# Patient Record
Sex: Female | Born: 1960
Health system: Southern US, Community
[De-identification: ages and names within clinical notes are randomized; demographics above are authoritative.]

## PROBLEM LIST (undated history)

## (undated) DIAGNOSIS — R635 Abnormal weight gain: Secondary | ICD-10-CM

## (undated) DIAGNOSIS — M2550 Pain in unspecified joint: Secondary | ICD-10-CM

## (undated) DIAGNOSIS — K5730 Diverticulosis of large intestine without perforation or abscess without bleeding: Secondary | ICD-10-CM

## (undated) DIAGNOSIS — E538 Deficiency of other specified B group vitamins: Secondary | ICD-10-CM

## (undated) DIAGNOSIS — R5383 Other fatigue: Secondary | ICD-10-CM

## (undated) DIAGNOSIS — E7800 Pure hypercholesterolemia, unspecified: Secondary | ICD-10-CM

## (undated) HISTORY — DX: Pure hypercholesterolemia, unspecified: E78.00

## (undated) HISTORY — DX: Pain in unspecified joint: M25.50

## (undated) HISTORY — DX: Abnormal weight gain: R63.5

## (undated) HISTORY — DX: Diverticulosis of large intestine without perforation or abscess without bleeding: K57.30

## (undated) HISTORY — DX: Deficiency of other specified B group vitamins: E53.8

## (undated) HISTORY — DX: Other fatigue: R53.83

---

## 2001-12-06 ENCOUNTER — Encounter: Payer: Self-pay | Admitting: Family Medicine

## 2001-12-06 ENCOUNTER — Encounter: Admission: RE | Admit: 2001-12-06 | Discharge: 2001-12-06 | Payer: Self-pay | Admitting: Family Medicine

## 2003-08-12 ENCOUNTER — Other Ambulatory Visit: Admission: RE | Admit: 2003-08-12 | Discharge: 2003-08-12 | Payer: Self-pay | Admitting: Family Medicine

## 2003-08-28 ENCOUNTER — Encounter: Admission: RE | Admit: 2003-08-28 | Discharge: 2003-08-28 | Payer: Self-pay | Admitting: Family Medicine

## 2004-06-30 ENCOUNTER — Ambulatory Visit: Payer: Self-pay | Admitting: Internal Medicine

## 2004-11-19 ENCOUNTER — Ambulatory Visit: Payer: Self-pay | Admitting: Internal Medicine

## 2005-01-13 ENCOUNTER — Ambulatory Visit: Payer: Self-pay | Admitting: Internal Medicine

## 2005-03-09 ENCOUNTER — Ambulatory Visit: Payer: Self-pay | Admitting: Internal Medicine

## 2005-07-07 ENCOUNTER — Ambulatory Visit: Payer: Self-pay | Admitting: Internal Medicine

## 2005-07-12 ENCOUNTER — Ambulatory Visit: Payer: Self-pay | Admitting: Internal Medicine

## 2005-10-28 ENCOUNTER — Other Ambulatory Visit: Admission: RE | Admit: 2005-10-28 | Discharge: 2005-10-28 | Payer: Self-pay | Admitting: Gynecology

## 2007-02-17 ENCOUNTER — Ambulatory Visit: Payer: Self-pay | Admitting: Internal Medicine

## 2007-02-17 DIAGNOSIS — R5383 Other fatigue: Secondary | ICD-10-CM

## 2007-02-17 DIAGNOSIS — K5730 Diverticulosis of large intestine without perforation or abscess without bleeding: Secondary | ICD-10-CM | POA: Insufficient documentation

## 2007-02-17 DIAGNOSIS — B079 Viral wart, unspecified: Secondary | ICD-10-CM | POA: Insufficient documentation

## 2007-02-17 DIAGNOSIS — R5381 Other malaise: Secondary | ICD-10-CM | POA: Insufficient documentation

## 2007-02-17 HISTORY — DX: Diverticulosis of large intestine without perforation or abscess without bleeding: K57.30

## 2007-02-18 ENCOUNTER — Telehealth: Payer: Self-pay | Admitting: Internal Medicine

## 2007-02-20 ENCOUNTER — Encounter (INDEPENDENT_AMBULATORY_CARE_PROVIDER_SITE_OTHER): Payer: Self-pay | Admitting: *Deleted

## 2007-03-15 ENCOUNTER — Ambulatory Visit: Payer: Self-pay | Admitting: Internal Medicine

## 2007-03-15 DIAGNOSIS — N3090 Cystitis, unspecified without hematuria: Secondary | ICD-10-CM | POA: Insufficient documentation

## 2007-03-16 LAB — CONVERTED CEMR LAB
Albumin: 3.7 g/dL (ref 3.5–5.2)
Basophils Absolute: 0.1 10*3/uL (ref 0.0–0.1)
Bilirubin, Direct: 0.2 mg/dL (ref 0.0–0.3)
Cholesterol: 277 mg/dL (ref 0–200)
Creatinine, Ser: 0.9 mg/dL (ref 0.4–1.2)
Direct LDL: 211.3 mg/dL
Eosinophils Absolute: 0.3 10*3/uL (ref 0.0–0.6)
Glucose, Bld: 97 mg/dL (ref 70–99)
HCT: 41.5 % (ref 36.0–46.0)
Hemoglobin, Urine: NEGATIVE
Hemoglobin: 14.2 g/dL (ref 12.0–15.0)
MCHC: 34.2 g/dL (ref 30.0–36.0)
MCV: 90.4 fL (ref 78.0–100.0)
Monocytes Absolute: 0.3 10*3/uL (ref 0.2–0.7)
Neutrophils Relative %: 45.7 % (ref 43.0–77.0)
Potassium: 4.4 meq/L (ref 3.5–5.1)
Sodium: 140 meq/L (ref 135–145)
Total Bilirubin: 0.8 mg/dL (ref 0.3–1.2)
Total Protein: 6.9 g/dL (ref 6.0–8.3)
Urobilinogen, UA: 0.2 (ref 0.0–1.0)
Vitamin B-12: 166 pg/mL — ABNORMAL LOW (ref 211–911)

## 2007-03-31 ENCOUNTER — Encounter: Payer: Self-pay | Admitting: Internal Medicine

## 2007-05-05 ENCOUNTER — Telehealth: Payer: Self-pay | Admitting: Internal Medicine

## 2007-08-18 ENCOUNTER — Ambulatory Visit: Payer: Self-pay | Admitting: Internal Medicine

## 2007-08-18 DIAGNOSIS — E538 Deficiency of other specified B group vitamins: Secondary | ICD-10-CM

## 2007-08-18 DIAGNOSIS — D485 Neoplasm of uncertain behavior of skin: Secondary | ICD-10-CM | POA: Insufficient documentation

## 2007-08-18 DIAGNOSIS — E559 Vitamin D deficiency, unspecified: Secondary | ICD-10-CM | POA: Insufficient documentation

## 2007-08-18 HISTORY — DX: Deficiency of other specified B group vitamins: E53.8

## 2007-08-18 LAB — CONVERTED CEMR LAB
Bilirubin Urine: NEGATIVE
Hemoglobin, Urine: NEGATIVE
Ketones, ur: NEGATIVE mg/dL
Mucus, UA: NEGATIVE
Total Protein, Urine: NEGATIVE mg/dL
Urine Glucose: NEGATIVE mg/dL

## 2007-09-01 ENCOUNTER — Telehealth: Payer: Self-pay | Admitting: Internal Medicine

## 2008-04-26 ENCOUNTER — Encounter: Payer: Self-pay | Admitting: Internal Medicine

## 2008-06-24 ENCOUNTER — Telehealth: Payer: Self-pay | Admitting: Internal Medicine

## 2008-08-12 ENCOUNTER — Telehealth (INDEPENDENT_AMBULATORY_CARE_PROVIDER_SITE_OTHER): Payer: Self-pay | Admitting: *Deleted

## 2008-08-13 ENCOUNTER — Ambulatory Visit: Payer: Self-pay | Admitting: Internal Medicine

## 2008-08-13 DIAGNOSIS — K921 Melena: Secondary | ICD-10-CM | POA: Insufficient documentation

## 2008-08-13 DIAGNOSIS — J069 Acute upper respiratory infection, unspecified: Secondary | ICD-10-CM | POA: Insufficient documentation

## 2008-08-13 LAB — CONVERTED CEMR LAB: Vit D, 25-Hydroxy: 37 ng/mL (ref 30–89)

## 2008-08-14 LAB — CONVERTED CEMR LAB
ALT: 22 units/L (ref 0–35)
BUN: 7 mg/dL (ref 6–23)
Basophils Relative: 0.2 % (ref 0.0–3.0)
Bilirubin Urine: NEGATIVE
Bilirubin, Direct: 0.1 mg/dL (ref 0.0–0.3)
CO2: 30 meq/L (ref 19–32)
Chloride: 108 meq/L (ref 96–112)
Creatinine, Ser: 0.9 mg/dL (ref 0.4–1.2)
Eosinophils Absolute: 0.2 10*3/uL (ref 0.0–0.7)
HCT: 42.2 % (ref 36.0–46.0)
Hemoglobin, Urine: NEGATIVE
Leukocytes, UA: NEGATIVE
Lymphs Abs: 1.1 10*3/uL (ref 0.7–4.0)
MCHC: 34.9 g/dL (ref 30.0–36.0)
MCV: 91.8 fL (ref 78.0–100.0)
Monocytes Absolute: 0.4 10*3/uL (ref 0.1–1.0)
Neutrophils Relative %: 72.7 % (ref 43.0–77.0)
Nitrite: NEGATIVE
Platelets: 199 10*3/uL (ref 150.0–400.0)
Potassium: 4.7 meq/L (ref 3.5–5.1)
TSH: 2.91 microintl units/mL (ref 0.35–5.50)
Total Bilirubin: 0.6 mg/dL (ref 0.3–1.2)
Total Protein, Urine: NEGATIVE mg/dL
Total Protein: 7.4 g/dL (ref 6.0–8.3)
Urobilinogen, UA: 0.2 (ref 0.0–1.0)

## 2009-01-31 ENCOUNTER — Ambulatory Visit: Payer: Self-pay | Admitting: Gastroenterology

## 2009-02-10 ENCOUNTER — Encounter: Payer: Self-pay | Admitting: Gastroenterology

## 2009-02-10 ENCOUNTER — Ambulatory Visit: Payer: Self-pay | Admitting: Gastroenterology

## 2009-02-11 ENCOUNTER — Telehealth: Payer: Self-pay | Admitting: Internal Medicine

## 2009-02-12 ENCOUNTER — Encounter: Payer: Self-pay | Admitting: Gastroenterology

## 2009-02-13 ENCOUNTER — Encounter (INDEPENDENT_AMBULATORY_CARE_PROVIDER_SITE_OTHER): Payer: Self-pay | Admitting: *Deleted

## 2009-06-06 ENCOUNTER — Encounter: Payer: Self-pay | Admitting: Internal Medicine

## 2009-09-30 ENCOUNTER — Ambulatory Visit: Payer: Self-pay | Admitting: Internal Medicine

## 2009-09-30 DIAGNOSIS — R109 Unspecified abdominal pain: Secondary | ICD-10-CM | POA: Insufficient documentation

## 2009-10-01 LAB — CONVERTED CEMR LAB
ALT: 25 units/L (ref 0–35)
Alkaline Phosphatase: 32 units/L — ABNORMAL LOW (ref 39–117)
BUN: 14 mg/dL (ref 6–23)
Basophils Relative: 1.5 % (ref 0.0–3.0)
Bilirubin Urine: NEGATIVE
Bilirubin, Direct: 0.1 mg/dL (ref 0.0–0.3)
Calcium: 9.1 mg/dL (ref 8.4–10.5)
Chloride: 105 meq/L (ref 96–112)
Creatinine, Ser: 0.9 mg/dL (ref 0.4–1.2)
Eosinophils Relative: 5.2 % — ABNORMAL HIGH (ref 0.0–5.0)
GFR calc non Af Amer: 74.77 mL/min (ref >60)
Lymphocytes Relative: 38 % (ref 12.0–46.0)
MCV: 92.2 fL (ref 78.0–100.0)
Monocytes Absolute: 0.4 10*3/uL (ref 0.1–1.0)
Monocytes Relative: 7.3 % (ref 3.0–12.0)
Neutrophils Relative %: 48 % (ref 43.0–77.0)
Nitrite: NEGATIVE
Platelets: 239 10*3/uL (ref 150.0–400.0)
RBC: 4.54 M/uL (ref 3.87–5.11)
Total Bilirubin: 0.4 mg/dL (ref 0.3–1.2)
Total Protein, Urine: NEGATIVE mg/dL
Total Protein: 7 g/dL (ref 6.0–8.3)
Urine Glucose: NEGATIVE mg/dL
WBC: 5.6 10*3/uL (ref 4.5–10.5)
pH: 6.5 (ref 5.0–8.0)

## 2009-10-08 ENCOUNTER — Telehealth: Payer: Self-pay | Admitting: Internal Medicine

## 2009-10-10 ENCOUNTER — Ambulatory Visit: Payer: Self-pay | Admitting: Cardiology

## 2009-10-14 ENCOUNTER — Telehealth (INDEPENDENT_AMBULATORY_CARE_PROVIDER_SITE_OTHER): Payer: Self-pay | Admitting: *Deleted

## 2009-10-16 ENCOUNTER — Ambulatory Visit: Payer: Self-pay | Admitting: Internal Medicine

## 2010-04-28 NOTE — Progress Notes (Signed)
Summary: Abd CT scan?  Phone Note Call from Patient Call back at Work Phone [phone]   Caller: Patient [phone] Summary of Call: Pt called stating that she is still having abd pain after completing course of ABX. Pt is requesting to have CT scan of abd done. Initial call taken by: Margaret Pyle, CMA,  October 08, 2009 11:14 AM  Follow-up for Phone Call        order placed requesting same [facility] will arrange - will call with results of scan after reviewed Follow-up by: Newt Lukes MD,  October 08, 2009 12:56 PM  Additional Follow-up for Phone Call Additional follow up Details #1::        pt informed Additional Follow-up by: Margaret Pyle, CMA,  October 08, 2009 2:01 PM

## 2010-04-28 NOTE — Progress Notes (Signed)
  Phone Note Other Incoming   Summary of Call: Abd pain - needs OV Initial call taken by: Tresa Garter MD,  October 14, 2009 7:14 AM  Follow-up for Phone Call        ok OV at 1:45 on Thursday Follow-up by: Tresa Garter MD,  October 14, 2009 7:15 AM  Additional Follow-up for Phone Call Additional follow up Details #1::        left message to vm to cb, appt made for 7/21@1 :45 pm. Additional Follow-up by: Verdell Face,  October 14, 2009 11:18 AM

## 2010-04-28 NOTE — Assessment & Plan Note (Signed)
Summary: ABD PAIN/NWS   Vital Signs:  Patient profile:   50 year old female Height:      67 inches (170.18 cm) Weight:      185.0 pounds (84.09 kg) O2 Sat:      97 % on Room air Temp:     98.5 degrees F (36.94 degrees C) oral Pulse rate:   54 / minute BP sitting:   112 / 78  (left arm) Cuff size:   regular  Vitals Entered By: Orlan Leavens (September 30, 2009 4:18 PM)  O2 Flow:  Room air CC: Abdominal pain x's 4 days Is Patient Diabetic? No Pain Assessment Patient in pain? yes     Location: abdomen Type: burning Comments Pt states she also have constipation   Primary Care Provider:  Sonda Primes, MD  CC:  Abdominal pain x's 4 days.  History of Present Illness:  Abdominal Pain      This is a 50 year old woman who presents with Abdominal pain.  The symptoms began 4 days ago.  On a scale of 1 to 10, the intensity is described as a 5.  The patient reports constipation, but denies nausea, vomiting, diarrhea, melena, hematochezia, and hematemesis.  The location of the pain is diffuse.  The pain is described as intermittent, sharp, and cramping in quality.  Associated symptoms include dysuria and vaginal bleeding.  The patient denies the following symptoms: fever, weight loss, chest pain, jaundice, and missed menstrual period.  The pain is worse with jarring of the abdomen.  The pain is better with defecation.    Clinical Review Panels:  CBC   WBC:  6.1 (08/13/2008)   RBC:  4.60 (08/13/2008)   Hgb:  14.7 (08/13/2008)   Hct:  42.2 (08/13/2008)   Platelets:  199.0 (08/13/2008)   MCV  91.8 (08/13/2008)   MCHC  34.9 (08/13/2008)   RDW  12.0 (08/13/2008)   PMN:  72.7 (08/13/2008)   Lymphs:  17.8 (08/13/2008)   Monos:  6.8 (08/13/2008)   Eosinophils:  2.5 (08/13/2008)   Basophil:  0.2 (08/13/2008)  Complete Metabolic Panel   Glucose:  96 (08/13/2008)   Sodium:  141 (08/13/2008)   Potassium:  4.7 (08/13/2008)   Chloride:  108 (08/13/2008)   CO2:  30 (08/13/2008)   BUN:  7  (08/13/2008)   Creatinine:  0.9 (08/13/2008)   Albumin:  3.8 (08/13/2008)   Total Protein:  7.4 (08/13/2008)   Calcium:  9.3 (08/13/2008)   Total Bili:  0.6 (08/13/2008)   Alk Phos:  32 (08/13/2008)   SGPT (ALT):  22 (08/13/2008)   SGOT (AST):  16 (08/13/2008)   Current Medications (verified): 1)  Vitamin D3 1000 Unit  Tabs (Cholecalciferol) .Marland Kitchen.. 1 Qd 2)  Vitamin B-12 Cr 1000 Mcg  Tbcr (Cyanocobalamin) .... Take One Tablet By Mouth Daily 3)  Triamcinolone Acetonide 0.1 % Oint (Triamcinolone Acetonide) .... As Needed  Allergies (verified): No Known Drug Allergies  Past History:  Past Medical History: Ovar. cysts Vit D def Vit B12 def  Review of Systems  The patient denies anorexia, weight loss, chest pain, syncope, peripheral edema, headaches, and severe indigestion/heartburn.    Physical Exam  General:  alert, well-developed, well-nourished, and cooperative to examination.   nontoxic Eyes:  vision grossly intact; pupils equal, round and reactive to light.  conjunctiva and lids normal.   no jaundice Lungs:  normal respiratory effort, no intercostal retractions or use of accessory muscles; normal breath sounds bilaterally - no crackles and no  wheezes.    Heart:  normal rate, regular rhythm, no murmur, and no rub. BLE without edema.  Abdomen:  soft, non-tender, normal bowel sounds, no distention; no masses and no appreciable hepatomegaly or splenomegaly.     Impression & Recommendations:  Problem # 1:  ABDOMINAL PAIN (ICD-789.00)  a/w constipation (change in bowels) and intermittent pain x 4 days not a/w activity or food - recent menses (had thought she was menopausal as no period >6 months prior to this) - also hx diverticulosis - ?infx chceck labs now emperic abx to cover poss diverticulitis or UTI - tylnol/ibuprogfen as needed - consider need for CT if worsening symptoms or not improved withthis abx tx - explained same to pt who agrees Orders: TLB-BMP (Basic  Metabolic Panel-BMET) (80048-METABOL) TLB-CBC Platelet - w/Differential (85025-CBCD) TLB-Hepatic/Liver Function Pnl (80076-HEPATIC) TLB-TSH (Thyroid Stimulating Hormone) (84443-TSH) TLB-Udip w/ Micro (81001-URINE) Prescription Created Electronically [id number])  Discussed symptom control with the patient.   Complete Medication List: 1)  Vitamin D3 1000 Unit Tabs (Cholecalciferol) .Marland Kitchen.. 1 qd 2)  Vitamin B-12 Cr 1000 Mcg Tbcr (Cyanocobalamin) .... Take one tablet by mouth daily 3)  Triamcinolone Acetonide 0.1 % Oint (Triamcinolone acetonide) .... As needed 4)  Metronidazole 500 Mg Tabs (Metronidazole) .Marland Kitchen.. 1 by mouth three times a day x 7days 5)  Ciprofloxacin Hcl 500 Mg Tabs (Ciprofloxacin hcl) .Marland Kitchen.. 1 by mouth two times a day x 7days  Patient Instructions: 1)  it was good to see you today. 2)  test(s) ordered today - your results will be posted on the phone tree for review in 48-72 hours from the time of test completion; call [phone] and enter your 9 digit MRN (listed above on this page, just below your name); if any changes need to be made or there are abnormal results, you will be contacted directly.  3)  antibiotcs as discussed for possible infection - cipro+flagyl  -your prescriptions have been electronically submitted to your pharmacy. Please take as directed. Contact our office if you believe you're having problems with the medication(s).  4)  Please schedule a follow-up appointment in 2-4 weeks with dr. Posey Rea for your physical and labs, call sooner if problems.  5)  Get plenty of rest, drink lots of clear liquids, and use Tylenol or Ibuprofen for fever and comfort, use stool softener for constipation if needed. Return in 7-10 days if you're not better:sooner if you're feeling worse. Prescriptions: CIPROFLOXACIN HCL 500 MG TABS (CIPROFLOXACIN HCL) 1 by mouth two times a day x 7days  #14 x 0   Entered and Authorized by:   Newt Lukes MD   Signed by:   Newt Lukes MD on  09/30/2009   Method used:   Electronically to        [facility]  [address] [phone][facility] (retail)       [address] [place], [place]  [address]       Ph: [phone] or [phone]       Fax: [phone]   RxID:   [id number] METRONIDAZOLE 500 MG TABS (METRONIDAZOLE) 1 by mouth three times a day x 7days  #21 x 0   Entered and Authorized by:   Newt Lukes MD   Signed by:   Newt Lukes MD on 09/30/2009   Method used:   [facility]ically to        [facility]  [address] [phone][facility] (retail)  [address]       [place], [place]  [address]       Ph: [phone] or [phone]       Fax: [phone]   RxID:   [id number]

## 2010-04-28 NOTE — Assessment & Plan Note (Signed)
Summary: abd pain / 1:45 per Dr Posey Rea / cd   Vital Signs:  Patient profile:   50 year old female Height:      67 inches Weight:      181 pounds BMI:     28.45 O2 Sat:      96 % on Room air Temp:     98.7 degrees F oral Pulse rate:   76 / minute Pulse rhythm:   regular Resp:     16 per minute BP sitting:   126 / 84  (left arm) Cuff size:   regular  Vitals Entered By: Lanier Prude, CMA(AAMA) (October 16, 2009 1:51 PM)  O2 Flow:  Room air CC: pelvic/abd pain X 2 wks   Primary Care Provider:  Sonda Primes, MD  CC:  pelvic/abd pain X 2 wks.  History of Present Illness: The patient presents for a follow up of abd pain and spasms - overalll better.Marland KitchenMarland KitchenMarland KitchenLeaving for [place] soon. She had finished the antibiotic.  Current Medications (verified): 1)  Vitamin D3 1000 Unit  Tabs (Cholecalciferol) .Marland Kitchen.. 1 Qd 2)  Vitamin B-12 Cr 1000 Mcg  Tbcr (Cyanocobalamin) .... Take One Tablet By Mouth Daily 3)  Triamcinolone Acetonide 0.1 % Oint (Triamcinolone Acetonide) .... As Needed  Allergies (verified): No Known Drug Allergies  Past History:  Past Medical History: Last updated: 09/30/2009 Ovar. 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  The patient denies fever, weight loss, weight gain, chest pain, dyspnea on exertion, melena, hematochezia, and severe indigestion/heartburn.    Physical Exam  General:  alert, well-developed, well-nourished, and cooperative to examination.   nontoxic Nose:  External nasal examination shows no deformity or inflammation. Nasal mucosa are pink and moist without lesions or  exudates. Mouth:  Oral mucosa and oropharynx without lesions or exudates.  Teeth in good repair. Neck:  No deformities, masses, or tenderness noted. Lungs:  Normal respiratory effort, chest expands symmetrically. Lungs are clear to auscultation, no crackles or wheezes. Heart:  Normal rate and regular rhythm. S1 and S2 normal without gallop, murmur, click, rub or other extra sounds. Abdomen:  Bowel sounds positive,abdomen soft and non-tender without masses, organomegaly or hernias noted. Msk:  No deformity or scoliosis noted of thoracic or lumbar spine.   Pulses:  R and L carotid,radial,femoral,dorsalis pedis and posterior tibial pulses are full and equal bilaterally Extremities:  No clubbing, cyanosis, edema, or deformity noted with normal full range of motion of all joints.   Neurologic:  No cranial nerve deficits noted. Station and gait are normal. Plantar reflexes are down-going bilaterally. DTRs are symmetrical throughout. Sensory, motor and coordinative functions appear intact. Skin:  Intact without suspicious lesions or rashes Psych:  Cognition and judgment appear intact. Alert and cooperative with normal attention span and concentration. No apparent delusions, illusions, hallucinations   Impression & Recommendations:  Problem # 1:  ABDOMINAL PAIN (ICD-789.00) Assessment Improved The exact etiology is unclear. At this point would treat constipation. The labs and CT were reviewed with the patient.   Problem # 2:  B12 DEFICIENCY (ICD-266.2) Assessment: Improved On rx  Problem # 3:  DIVERTICULOSIS,  COLON (ICD-562.10) Assessment: Unchanged  Problem # 4:  FATIGUE (ICD-780.79) Assessment: Improved  Complete Medication List: 1)  Vitamin D3 1000 Unit Tabs (Cholecalciferol) .Marland Kitchen.. 1 qd 2)  Vitamin B-12 Cr 1000 Mcg Tbcr (Cyanocobalamin) .... Take one tablet by mouth daily 3)  Triamcinolone Acetonide 0.1 % Oint (Triamcinolone acetonide) .... As needed 4)  Metamucil 0.52 Gm Caps (Psyllium)  .Marland Kitchen.. 1 by mouth two times a day for constipation 5)  Tramadol Hcl 50 Mg Tabs (Tramadol hcl) .Marland Kitchen.. 1-2 tabs by mouth two times a day as needed pain  Patient Instructions: 1)  Call if you are not better in a reasonable amount of time or if worse.  2)  Please schedule a follow-up appointment in 3 months. 3)  BMP prior to visit, ICD-9: 4)  TSH prior to visit, ICD-9: 5)  CBC w/ Diff prior to visit, ICD-9: 6)  Urine-dip prior to visit, ICD-9:789.00 26.20 7)  Vit D Prescriptions: ACYCLOVIR 800 MG TABS (ACYCLOVIR) 1 by mouth 5 times a day  #35 x 1   Entered and Authorized by:   Tresa Garter MD   Signed by:   Tresa Garter MD on 10/16/2009   Method used:   Print then Give to Patient   RxID:   [id number] TRAMADOL HCL 50 MG TABS (TRAMADOL HCL) 1-2 tabs by mouth two times a day as needed pain  #60 x 1   Entered and Authorized by:   Tresa Garter MD   Signed by:   Tresa Garter MD on 10/16/2009   Method used:   Print then Give to Patient   RxID:   [id number] METAMUCIL 0.52 GM CAPS (PSYLLIUM) 1 by mouth two times a day for constipation  #100 x 3   Entered and Authorized by:   Tresa Garter MD   Signed by:   Tresa Garter MD on 10/16/2009   Method used:   Print then Give to Patient   RxID:   [id number]

## 2010-05-26 ENCOUNTER — Encounter: Payer: Self-pay | Admitting: Internal Medicine

## 2010-05-26 ENCOUNTER — Other Ambulatory Visit: Payer: BC Managed Care – PPO

## 2010-05-26 ENCOUNTER — Other Ambulatory Visit: Payer: Self-pay | Admitting: Internal Medicine

## 2010-05-26 ENCOUNTER — Ambulatory Visit (INDEPENDENT_AMBULATORY_CARE_PROVIDER_SITE_OTHER): Payer: Self-pay | Admitting: Internal Medicine

## 2010-05-26 DIAGNOSIS — M545 Low back pain, unspecified: Secondary | ICD-10-CM | POA: Insufficient documentation

## 2010-05-26 LAB — URINALYSIS
Bilirubin Urine: NEGATIVE
Hgb urine dipstick: NEGATIVE
Total Protein, Urine: NEGATIVE
Urine Glucose: NEGATIVE

## 2010-06-09 NOTE — Assessment & Plan Note (Signed)
Summary: BACKACHE----STC   Vital Signs:  Patient profile:   50 year old female Height:      67 inches (170.18 cm) Weight:      192.13 pounds (87.33 kg) BMI:     30.20 O2 Sat:      99 % on Room air Temp:     97.3 degrees F (36.28 degrees C) oral Pulse rate:   54 / minute Resp:     14 per minute BP sitting:   108 / 64  (left arm) Cuff size:   regular  Vitals Entered By: Burnard Leigh CMA(AAMA) (May 26, 2010 10:10 AM)  O2 Flow:  Room air CC: Pt c/o of lower back pain that began on left side and radiated to right side x1 month/sls,cma Is Patient Diabetic? No   Primary Care Provider:  Sonda Primes, MD  CC:  Pt c/o of lower back pain that began on left side and radiated to right side x1 month/sls and cma.  History of Present Illness: c/o R LBP x 1 month off and on worse with standing, better w/exercise.  Preventive Screening-Counseling & Management  Caffeine-Diet-Exercise     Does Patient Exercise: yes  Current Medications (verified): 1)  Vitamin D3 1000 Unit  Tabs (Cholecalciferol) .Marland Kitchen.. 1 Qd 2)  Vitamin B-12 Cr 1000 Mcg  Tbcr (Cyanocobalamin) .... Take One Tablet By Mouth Daily 3)  Triamcinolone Acetonide 0.1 % Oint (Triamcinolone Acetonide) .... As Needed 4)  Metamucil 0.52 Gm Caps (Psyllium) .Marland Kitchen.. 1 By Mouth Two Times A Day For Constipation 5)  Tramadol Hcl 50 Mg Tabs (Tramadol Hcl) .Marland Kitchen.. 1-2 Tabs By Mouth Two Times A Day As Needed Pain  Allergies (verified): No Known Drug Allergies  Past History:  Past Medical History: Last updated: 09/30/2009 Ovar. 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    Physical Exam  General:  alert, well-developed, well-nourished, and  cooperative to examination. Mouth:  Oral mucosa and oropharynx without lesions or exudates.  Teeth in good repair. Lungs:  Normal respiratory effort, chest expands symmetrically. Lungs are clear to auscultation, no crackles or wheezes. Heart:  Normal rate and regular rhythm. S1 and S2 normal without gallop, murmur, click, rub or other extra sounds. Abdomen:  Bowel sounds positive,abdomen soft and non-tender without masses, organomegaly or hernias noted. Msk:  No deformity or scoliosis noted of thoracic or lumbar spine.   Extremities:  nl Skin:  Intact without suspicious lesions or rashes Psych:  Cognition and judgment appear intact. Alert and cooperative with normal attention span and concentration. No apparent delusions, illusions, hallucinations   Impression & Recommendations:  Problem # 1:  LOW BACK PAIN, ACUTE (ICD-724.2) R likely MSK strain Assessment New See "Patient Instructions".  Her updated medication list for this problem includes:    Tramadol Hcl 50 Mg Tabs (Tramadol hcl) .Marland Kitchen... 1-2 tabs by mouth two times a day as needed pain    Ibuprofen 600 Mg Tabs (Ibuprofen) .Marland Kitchen... 1 by mouth bid  pc x 1 wk then as needed for  pain  Orders: TLB-Udip ONLY (81003-UDIP)  Complete Medication List: 1)  Vitamin D3 1000 Unit Tabs (Cholecalciferol) .Marland Kitchen.. 1 qd 2)  Vitamin B-12 Cr 1000 Mcg Tbcr (Cyanocobalamin) .... Take one  tablet by mouth daily 3)  Triamcinolone Acetonide 0.1 % Oint (Triamcinolone acetonide) .... As needed 4)  Metamucil 0.52 Gm Caps (Psyllium) .Marland Kitchen.. 1 by mouth two times a day for constipation 5)  Tramadol Hcl 50 Mg Tabs (Tramadol hcl) .Marland Kitchen.. 1-2 tabs by mouth two times a day as needed pain 6)  Ibuprofen 600 Mg Tabs (Ibuprofen) .Marland Kitchen.. 1 by mouth bid  pc x 1 wk then as needed for  pain  Patient Instructions: 1)  Go on Youtube (www.youtube.com) and look up ", "IT band stretch" and "romboid stretch". Learn the anatomy and learn the symptoms. Do the stretches - it may help!  2)  Use  stretching and balance exercises that I have provided (15 min. or longer every day)  Prescriptions: TRAMADOL HCL 50 MG TABS (TRAMADOL HCL) 1-2 tabs by mouth two times a day as needed pain  #60 x 1   Entered and Authorized by:   Tresa Garter MD   Signed by:   Tresa Garter MD on 05/26/2010   Method used:   [facility]        [facility]  [address] [phone][facility] ([facility]ail)       [address] [place], [place]  [address]       Ph: [phone] or [phone]       Fax: [phone]   RxID:   [id number] IBUPROFEN 600 MG TABS (IBUPROFEN) 1 by mouth bid  pc x 1 wk then as needed for  pain  #60 x 3   Entered and Authorized by:   Tresa Garter MD   Signed by:   Tresa Garter MD on 05/26/2010   Method used:   [facility]  [address] [phone][facility] (retail)       [address] [place], [place]  [address]       Ph: [phone] or [phone]       Fax: [phone]   RxID:   [id number]    Orders Added: 1)  TLB-Udip ONLY [81003-UDIP] 2)  Est. Patient Level III [427[address]]

## 2010-06-24 ENCOUNTER — Encounter: Payer: Self-pay | Admitting: Internal Medicine

## 2010-12-14 ENCOUNTER — Telehealth: Payer: Self-pay | Admitting: *Deleted

## 2010-12-14 ENCOUNTER — Ambulatory Visit (INDEPENDENT_AMBULATORY_CARE_PROVIDER_SITE_OTHER): Payer: BC Managed Care – PPO | Admitting: Internal Medicine

## 2010-12-14 ENCOUNTER — Encounter: Payer: Self-pay | Admitting: Internal Medicine

## 2010-12-14 VITALS — BP 118/70 | HR 59 | Temp 98.1°F | Wt 189.0 lb

## 2010-12-14 DIAGNOSIS — H109 Unspecified conjunctivitis: Secondary | ICD-10-CM | POA: Insufficient documentation

## 2010-12-14 DIAGNOSIS — Z0000 Encounter for general adult medical examination without abnormal findings: Secondary | ICD-10-CM

## 2010-12-14 DIAGNOSIS — Z125 Encounter for screening for malignant neoplasm of prostate: Secondary | ICD-10-CM

## 2010-12-14 MED ORDER — TOBRAMYCIN 0.3 % OP SOLN: OPHTHALMIC | Status: DC

## 2010-12-14 NOTE — Patient Instructions (Signed)
Take all new medications as prescribed Continue all other medications as before  

## 2010-12-14 NOTE — Assessment & Plan Note (Signed)
Mild to mod, for antibx course,  to f/u any worsening symptoms or concerns 

## 2010-12-14 NOTE — Telephone Encounter (Signed)
Labs entered.

## 2010-12-14 NOTE — Progress Notes (Signed)
  Subjective:    Patient ID: Teresa Hubbard, female    DOB: Dec 31, 1960, 50 y.o.   MRN: [id number]  HPI Here with acute onset 2-3 days onset left eye matting, discomfort, redness, swelling and low grade temp, without trauma or eye exposure to allergens,chemicals,, etc.  Family has been ill in the past wk at home.  Pt denies chest pain, increased sob or doe, wheezing, orthopnea, PND, increased LE swelling, palpitations, dizziness or syncope.  Pt denies new neurological symptoms such as new headache, or facial or extremity weakness or numbness   Pt denies polydipsia, polyuria.   No other acute complaints.  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 - none per ptNo past surgical history on file.- none per pt  reports that she has never smoked. She does not have any smokeless tobacco history on file. She reports that she drinks alcohol. She reports that she does not use illicit drugs. family history is not on file. No Known Allergies No current outpatient prescriptions on file prior to visit.   Review of Systems All otherwise neg per pt     Objective:   Physical Exam BP 118/70  Pulse 59  Temp(Src) 98.1 F (36.7 C) (Oral)  Wt 189 lb (85.73 kg)  SpO2 96%  LMP 12/07/2010 Physical Exam  VS noted, not ill appearing Constitutional: Pt appears well-developed and well-nourished.  HENT: Head: Normocephalic.  Right Ear: External ear normal.  Left Ear: External ear normal.  Eyes: Conjunctivae and EOM are normal except for marked injection left conjunctiva, small hazy d/c. Pupils are equal, round, and reactive to light.  Neck: Normal range of motion. Neck supple.  Cardiovascular: Normal rate and regular rhythm.   Pulmonary/Chest: Effort normal and breath sounds normal.  Neurological: Pt is alert. No cranial nerve deficit.  Skin: Skin is warm. No erythema.  Psychiatric: Pt behavior is normal. Thought content normal.         Assessment &  Plan:

## 2010-12-20 ENCOUNTER — Other Ambulatory Visit: Payer: Self-pay | Admitting: Internal Medicine

## 2011-01-08 ENCOUNTER — Encounter: Payer: Self-pay | Admitting: Internal Medicine

## 2011-01-22 ENCOUNTER — Other Ambulatory Visit (INDEPENDENT_AMBULATORY_CARE_PROVIDER_SITE_OTHER): Payer: BC Managed Care – PPO

## 2011-01-22 ENCOUNTER — Other Ambulatory Visit: Payer: Self-pay | Admitting: Internal Medicine

## 2011-01-22 ENCOUNTER — Telehealth: Payer: Self-pay | Admitting: Internal Medicine

## 2011-01-22 DIAGNOSIS — Z0000 Encounter for general adult medical examination without abnormal findings: Secondary | ICD-10-CM

## 2011-01-22 DIAGNOSIS — Z125 Encounter for screening for malignant neoplasm of prostate: Secondary | ICD-10-CM

## 2011-01-22 LAB — CBC WITH DIFFERENTIAL/PLATELET
Basophils Absolute: 0.1 10*3/uL (ref 0.0–0.1)
Eosinophils Absolute: 0.2 10*3/uL (ref 0.0–0.7)
Eosinophils Relative: 4.6 % (ref 0.0–5.0)
HCT: 42.5 % (ref 36.0–46.0)
Lymphs Abs: 1.5 10*3/uL (ref 0.7–4.0)
MCHC: 33.8 g/dL (ref 30.0–36.0)
MCV: 92.5 fl (ref 78.0–100.0)
Monocytes Absolute: 0.3 10*3/uL (ref 0.1–1.0)
Neutrophils Relative %: 53.9 % (ref 43.0–77.0)
Platelets: 212 10*3/uL (ref 150.0–400.0)
RDW: 12.8 % (ref 11.5–14.6)
WBC: 4.7 10*3/uL (ref 4.5–10.5)

## 2011-01-22 LAB — LIPID PANEL
Cholesterol: 263 mg/dL — ABNORMAL HIGH (ref 0–200)
Total CHOL/HDL Ratio: 5
Triglycerides: 79 mg/dL (ref 0.0–149.0)

## 2011-01-22 LAB — BASIC METABOLIC PANEL
CO2: 26 mEq/L (ref 19–32)
Calcium: 9.1 mg/dL (ref 8.4–10.5)
Chloride: 109 mEq/L (ref 96–112)
Creatinine, Ser: 0.9 mg/dL (ref 0.4–1.2)
Glucose, Bld: 84 mg/dL (ref 70–99)

## 2011-01-22 LAB — URINALYSIS, ROUTINE W REFLEX MICROSCOPIC
Ketones, ur: NEGATIVE
Specific Gravity, Urine: >=1.03 (ref 1.000–1.030)
Urobilinogen, UA: 0.2 (ref 0.0–1.0)
pH: 5.5 (ref 5.0–8.0)

## 2011-01-22 LAB — HEPATIC FUNCTION PANEL
Bilirubin, Direct: 0.1 mg/dL (ref 0.0–0.3)
Total Bilirubin: 0.6 mg/dL (ref 0.3–1.2)

## 2011-01-22 LAB — PSA: PSA: 0 ng/mL — ABNORMAL LOW (ref 0.10–4.00)

## 2011-01-22 LAB — LDL CHOLESTEROL, DIRECT: Direct LDL: 204.9 mg/dL

## 2011-01-22 MED ORDER — CIPROFLOXACIN HCL 250 MG PO TABS: 250.0000 mg | ORAL_TABLET | Freq: Two times a day (BID) | ORAL | Status: AC

## 2011-01-22 NOTE — Telephone Encounter (Signed)
Teresa Hubbard, please, inform patient that all labs are normal except for a UTI Call in Cipro Thx

## 2011-01-25 NOTE — Telephone Encounter (Signed)
Pt informed

## 2011-01-26 ENCOUNTER — Encounter: Payer: Self-pay | Admitting: Internal Medicine

## 2011-01-26 ENCOUNTER — Ambulatory Visit (INDEPENDENT_AMBULATORY_CARE_PROVIDER_SITE_OTHER): Payer: BC Managed Care – PPO | Admitting: Internal Medicine

## 2011-01-26 VITALS — BP 126/90 | HR 68 | Temp 97.9°F | Wt 193.0 lb

## 2011-01-26 DIAGNOSIS — N3090 Cystitis, unspecified without hematuria: Secondary | ICD-10-CM | POA: Insufficient documentation

## 2011-01-26 DIAGNOSIS — Z23 Encounter for immunization: Secondary | ICD-10-CM

## 2011-01-26 DIAGNOSIS — E538 Deficiency of other specified B group vitamins: Secondary | ICD-10-CM

## 2011-01-26 DIAGNOSIS — E785 Hyperlipidemia, unspecified: Secondary | ICD-10-CM

## 2011-01-26 DIAGNOSIS — Z0000 Encounter for general adult medical examination without abnormal findings: Secondary | ICD-10-CM

## 2011-01-26 NOTE — Assessment & Plan Note (Signed)
2012 Declined statins

## 2011-01-26 NOTE — Assessment & Plan Note (Signed)
On Cipro UA in 1 mo

## 2011-01-26 NOTE — Progress Notes (Signed)
  Subjective:    Patient ID: Teresa Hubbard, female    DOB: 11-28-1960, 50 y.o.   MRN: [id number]  HPI  The patient is here for a wellness exam. The patient has been doing well overall without major physical or psychological issues going on lately. Wt Readings from Last 3 Encounters:  01/26/11 193 lb (87.544 kg)  12/14/10 189 lb (85.73 kg)  05/26/10 192 lb 2.1 oz (87.15 kg)    Review of Systems  Constitutional: Negative for fever, chills, diaphoresis, activity change, appetite change, fatigue and unexpected weight change.  HENT: Negative for hearing loss, ear pain, congestion, sore throat, sneezing, mouth sores, neck pain, dental problem, voice change, postnasal drip and sinus pressure.   Eyes: Negative for pain and visual disturbance.  Respiratory: Negative for cough, chest tightness, wheezing and stridor.   Cardiovascular: Negative for chest pain, palpitations and leg swelling.  Gastrointestinal: Negative for nausea, vomiting, abdominal pain, blood in stool, abdominal distention and rectal pain.  Genitourinary: Negative for dysuria, frequency, hematuria, decreased urine volume, vaginal bleeding, vaginal discharge, difficulty urinating, vaginal pain and menstrual problem.  Musculoskeletal: Negative for back pain, joint swelling and gait problem.  Skin: Negative for color change, pallor, rash and wound.  Neurological: Negative for dizziness, tremors, syncope, speech difficulty, weakness, light-headedness, numbness and headaches.  Hematological: Negative for adenopathy.  Psychiatric/Behavioral: Negative for suicidal ideas, hallucinations, behavioral problems, confusion, sleep disturbance, dysphoric mood and decreased concentration. The patient is not nervous/anxious and is not hyperactive.        Objective:   Physical Exam  Constitutional: She appears well-developed and well-nourished. No distress.  HENT:  Head: Normocephalic.  Right Ear: External ear normal.  Left Ear: External ear  normal.  Nose: Nose normal.  Mouth/Throat: Oropharynx is clear and moist.  Eyes: Conjunctivae are normal. Pupils are equal, round, and reactive to light. Right eye exhibits no discharge. Left eye exhibits no discharge.  Neck: Normal range of motion. Neck supple. No JVD present. No tracheal deviation present. No thyromegaly present.  Cardiovascular: Normal rate, regular rhythm and normal heart sounds.   Pulmonary/Chest: No stridor. No respiratory distress. She has no wheezes.  Abdominal: Soft. Bowel sounds are normal. She exhibits no distension and no mass. There is no tenderness. There is no rebound and no guarding.  Musculoskeletal: She exhibits no edema and no tenderness.  Lymphadenopathy:    She has no cervical adenopathy.  Neurological: She displays normal reflexes. No cranial nerve deficit. She exhibits normal muscle tone. Coordination normal.  Skin: No rash noted. No erythema.  Psychiatric: She has a normal mood and affect. Her behavior is normal. Judgment and thought content normal.   Lab Results  Component Value Date   WBC 4.7 01/22/2011   HGB 14.4 01/22/2011   HCT 42.5 01/22/2011   PLT 212.0 01/22/2011   GLUCOSE 84 01/22/2011   CHOL 263* 01/22/2011   TRIG 79.0 01/22/2011   HDL 55.70 01/22/2011   LDLDIRECT 204.9 01/22/2011   ALT 40* 01/22/2011   AST 23 01/22/2011   NA 141 01/22/2011   K 5.0 01/22/2011   CL 109 01/22/2011   CREATININE 0.9 01/22/2011   BUN 11 01/22/2011   CO2 26 01/22/2011   TSH 3.20 01/22/2011   PSA 0.00* 01/22/2011          Assessment & Plan:

## 2011-01-26 NOTE — Assessment & Plan Note (Signed)
We discussed age appropriate health related issues, including available/recomended screening tests and vaccinations. We discussed a need for adhering to healthy diet and exercise. Labs/EKG were reviewed/ordered. All questions were answered.   

## 2011-01-26 NOTE — Assessment & Plan Note (Signed)
Continue with current prescription therapy as reflected on the Med list.  

## 2011-04-29 ENCOUNTER — Telehealth: Payer: Self-pay | Admitting: Internal Medicine

## 2011-04-29 MED ORDER — CIPROFLOXACIN HCL 250 MG PO TABS: 250.0000 mg | ORAL_TABLET | Freq: Two times a day (BID) | ORAL | Status: AC

## 2011-04-29 NOTE — Telephone Encounter (Signed)
[facility]I - needs abx OK Cipro - done

## 2011-10-19 ENCOUNTER — Encounter: Payer: Self-pay | Admitting: Internal Medicine

## 2011-10-29 ENCOUNTER — Encounter: Payer: Self-pay | Admitting: Internal Medicine

## 2011-10-29 ENCOUNTER — Ambulatory Visit (INDEPENDENT_AMBULATORY_CARE_PROVIDER_SITE_OTHER): Payer: BC Managed Care – PPO | Admitting: Internal Medicine

## 2011-10-29 ENCOUNTER — Other Ambulatory Visit (INDEPENDENT_AMBULATORY_CARE_PROVIDER_SITE_OTHER): Payer: BC Managed Care – PPO

## 2011-10-29 VITALS — BP 130/78 | HR 86 | Temp 98.8°F | Resp 16 | Wt 195.0 lb

## 2011-10-29 DIAGNOSIS — R109 Unspecified abdominal pain: Secondary | ICD-10-CM

## 2011-10-29 DIAGNOSIS — E559 Vitamin D deficiency, unspecified: Secondary | ICD-10-CM

## 2011-10-29 DIAGNOSIS — K5730 Diverticulosis of large intestine without perforation or abscess without bleeding: Secondary | ICD-10-CM | POA: Insufficient documentation

## 2011-10-29 DIAGNOSIS — E538 Deficiency of other specified B group vitamins: Secondary | ICD-10-CM

## 2011-10-29 LAB — URINALYSIS, ROUTINE W REFLEX MICROSCOPIC
Total Protein, Urine: 30
Urine Glucose: NEGATIVE
pH: 6 (ref 5.0–8.0)

## 2011-10-29 LAB — HEPATIC FUNCTION PANEL
ALT: 21 U/L (ref 0–35)
AST: 16 U/L (ref 0–37)
Albumin: 3.7 g/dL (ref 3.5–5.2)
Alkaline Phosphatase: 33 U/L — ABNORMAL LOW (ref 39–117)
Total Protein: 7.1 g/dL (ref 6.0–8.3)

## 2011-10-29 LAB — CBC WITH DIFFERENTIAL/PLATELET
Basophils Absolute: 0 10*3/uL (ref 0.0–0.1)
Hemoglobin: 13.6 g/dL (ref 12.0–15.0)
Lymphocytes Relative: 10.3 % — ABNORMAL LOW (ref 12.0–46.0)
Monocytes Relative: 6.3 % (ref 3.0–12.0)
Neutro Abs: 9.7 10*3/uL — ABNORMAL HIGH (ref 1.4–7.7)
RDW: 12.9 % (ref 11.5–14.6)
WBC: 11.8 10*3/uL — ABNORMAL HIGH (ref 4.5–10.5)

## 2011-10-29 MED ORDER — HYDROCODONE-ACETAMINOPHEN 7.5-325 MG PO TABS: 1.0000 | ORAL_TABLET | Freq: Four times a day (QID) | ORAL | Status: AC | PRN

## 2011-10-29 MED ORDER — CIPROFLOXACIN HCL 500 MG PO TABS: 500.0000 mg | ORAL_TABLET | Freq: Two times a day (BID) | ORAL | Status: DC

## 2011-10-29 MED ORDER — PROMETHAZINE HCL 25 MG PO TABS: 25.0000 mg | ORAL_TABLET | Freq: Three times a day (TID) | ORAL | Status: DC | PRN

## 2011-10-29 NOTE — Assessment & Plan Note (Addendum)
8/13 diffuse R>L -- poss ovar cyst rupture vs other LMP 10/28/11 (5 d early) Labs CT if needed Clear liquids Emp[facility] Cipro

## 2011-10-29 NOTE — Patient Instructions (Addendum)
Clear liquids Go to ER if sick!

## 2011-10-30 ENCOUNTER — Encounter (HOSPITAL_COMMUNITY): Payer: Self-pay | Admitting: Emergency Medicine

## 2011-10-30 ENCOUNTER — Emergency Department (HOSPITAL_COMMUNITY)
Admission: EM | Admit: 2011-10-30 | Discharge: 2011-10-30 | Disposition: A | Discharge status: Home / Self Care | Payer: BC Managed Care – PPO | Attending: Emergency Medicine | Admitting: Emergency Medicine

## 2011-10-30 ENCOUNTER — Emergency Department (HOSPITAL_COMMUNITY): Payer: BC Managed Care – PPO

## 2011-10-30 DIAGNOSIS — K5730 Diverticulosis of large intestine without perforation or abscess without bleeding: Secondary | ICD-10-CM

## 2011-10-30 LAB — CBC WITH DIFFERENTIAL/PLATELET
Basophils Absolute: 0 10*3/uL (ref 0.0–0.1)
Basophils Relative: 0 % (ref 0–1)
Eosinophils Absolute: 0.1 10*3/uL (ref 0.0–0.7)
MCHC: 35 g/dL (ref 30.0–36.0)
Neutro Abs: 9.4 10*3/uL — ABNORMAL HIGH (ref 1.7–7.7)
Neutrophils Relative %: 85 % — ABNORMAL HIGH (ref 43–77)
RDW: 12.8 % (ref 11.5–15.5)

## 2011-10-30 LAB — URINE MICROSCOPIC-ADD ON

## 2011-10-30 LAB — COMPREHENSIVE METABOLIC PANEL
ALT: 18 U/L (ref 0–35)
Albumin: 3.4 g/dL — ABNORMAL LOW (ref 3.5–5.2)
Alkaline Phosphatase: 41 U/L (ref 39–117)
BUN: 9 mg/dL (ref 6–23)
Calcium: 9 mg/dL (ref 8.4–10.5)
Potassium: 3.7 mEq/L (ref 3.5–5.1)
Sodium: 134 mEq/L — ABNORMAL LOW (ref 135–145)
Total Protein: 7.4 g/dL (ref 6.0–8.3)

## 2011-10-30 LAB — URINALYSIS, ROUTINE W REFLEX MICROSCOPIC
Nitrite: NEGATIVE
Specific Gravity, Urine: 1.033 — ABNORMAL HIGH (ref 1.005–1.030)
pH: 5.5 (ref 5.0–8.0)

## 2011-10-30 LAB — POCT I-STAT TROPONIN I: Troponin i, poc: 0 ng/mL (ref 0.00–0.08)

## 2011-10-30 MED ORDER — IOHEXOL 300 MG/ML  SOLN
100.0000 mL | Freq: Once | INTRAMUSCULAR | Status: AC | PRN
  Administered 2011-10-30: 100 mL via INTRAVENOUS

## 2011-10-30 MED ORDER — HYDROMORPHONE HCL PF 1 MG/ML IJ SOLN
1.0000 mg | Freq: Once | INTRAMUSCULAR | Status: AC
  Administered 2011-10-30: 1 mg via INTRAVENOUS
  Filled 2011-10-30: qty 1

## 2011-10-30 MED ORDER — METRONIDAZOLE 500 MG PO TABS: 500.0000 mg | ORAL_TABLET | Freq: Two times a day (BID) | ORAL | Status: DC

## 2011-10-30 MED ORDER — ONDANSETRON HCL 4 MG/2ML IJ SOLN
4.0000 mg | Freq: Once | INTRAMUSCULAR | Status: AC
  Administered 2011-10-30: 4 mg via INTRAVENOUS
  Filled 2011-10-30: qty 2

## 2011-10-30 MED ORDER — SODIUM CHLORIDE 0.9 % IV SOLN
Freq: Once | INTRAVENOUS | Status: AC
  Administered 2011-10-30: 50 mL/h via INTRAVENOUS

## 2011-10-30 MED ORDER — METRONIDAZOLE IN NACL 5-0.79 MG/ML-% IV SOLN
500.0000 mg | Freq: Once | INTRAVENOUS | Status: AC
  Administered 2011-10-30: 500 mg via INTRAVENOUS
  Filled 2011-10-30: qty 100

## 2011-10-30 NOTE — ED Notes (Signed)
Patient reports that she went to MD today and was sent here due to lower right sided pain. The patient denies N/V/D

## 2011-10-30 NOTE — ED Provider Notes (Signed)
History     CSN: [id number]  Arrival date & time 10/29/11  2349   First MD Initiated Contact with Patient 10/30/11 [phone]      Chief Complaint  Patient presents with  . Abdominal Pain    sharp pain to right lower abdominal started 2 days ago    (Consider location/radiation/quality/duration/timing/severity/associated sxs/prior treatment) HPI Comments: States that on Wednesday.  She had diffuse, vague abdominal pain.  That has progressed over the past 48 hours to right lower quadrant pain.  She was seen by her primary care physician earlier today.  Was given a prescription for Vicodin, and Cipro, although she is unsure why.  She was given Cipro, urine, and blood tests were done at the office, but have not been resulted to.  She was told that if her pain got worse.  She was to come to the emergency department for further evaluation  Patient is a 51 y.o. female presenting with abdominal pain. The history is provided by the patient.  Abdominal Pain The primary symptoms of the illness include abdominal pain, nausea and vaginal bleeding. The primary symptoms of the illness do not include fever, shortness of breath, vomiting, diarrhea or dysuria. The current episode started more than 2 days ago. The onset of the illness was gradual.  The abdominal pain began more than 2 days ago. The pain came on gradually. The abdominal pain has been gradually worsening since its onset. The abdominal pain is located in the RLQ. The severity of the abdominal pain is 8/10. The abdominal pain is relieved by nothing. The abdominal pain is exacerbated by certain positions.  Symptoms associated with the illness do not include chills.    Past Medical History  Diagnosis Date  . B12 DEFICIENCY 08/18/2007  . DIVERTICULOSIS, COLON 02/17/2007  . Diverticulosis     History reviewed. No pertinent past surgical history.  History reviewed. No pertinent family history.  History  Substance Use Topics  . Smoking status:  Never Smoker   . Smokeless tobacco: Not on file  . Alcohol Use: Yes     wine occasional    OB History    Grav Para Term Preterm Abortions TAB SAB Ect Mult Living                  Review of Systems  Constitutional: Negative for fever and chills.  HENT: Negative for rhinorrhea.   Respiratory: Negative for shortness of breath.   Cardiovascular: Negative for chest pain.  Gastrointestinal: Positive for nausea and abdominal pain. Negative for vomiting and diarrhea.  Genitourinary: Positive for vaginal bleeding. Negative for dysuria.  Neurological: Negative for dizziness, weakness and headaches.    Allergies  Review of patient's allergies indicates no known allergies.  Home Medications   Current Outpatient Rx  Name Route Sig Dispense Refill  . CIPROFLOXACIN HCL 500 MG PO TABS Oral Take 1 tablet (500 mg total) by mouth 2 (two) times daily. 20 tablet 1  . CYANOCOBALAMIN PO Oral Take 1 each by mouth daily.     Marland Kitchen HYDROCODONE-ACETAMINOPHEN 7.5-325 MG PO TABS Oral Take 1 tablet by mouth 4 (four) times daily as needed for pain. 120 tablet 2  . VITAMIN C 500 MG PO TABS Oral Take 500 mg by mouth daily.      Marland Kitchen PROMETHAZINE HCL 25 MG PO TABS Oral Take 1 tablet (25 mg total) by mouth every 8 (eight) hours as needed for nausea. 20 tablet 0    BP 116/55  Pulse 76  Temp  99 F (37.2 C) (Oral)  Resp 20  Wt 185 lb (83.915 kg)  SpO2 99%  LMP 10/28/2011  Physical Exam  Constitutional: She appears well-developed and well-nourished.  HENT:  Head: Normocephalic.  Eyes: Pupils are equal, round, and reactive to light.  Neck: Normal range of motion.  Cardiovascular: Normal rate.   Abdominal: She exhibits no distension. There is no hepatosplenomegaly. There is tenderness in the right lower quadrant. There is rebound and guarding.  Genitourinary:       Currently on day 2 of her menstrual cycle  Musculoskeletal: Normal range of motion.  Neurological: She is alert.  Skin: Skin is warm.    ED  Course  Procedures (including critical care time)  Labs Reviewed  CBC WITH DIFFERENTIAL - Abnormal; Notable for the following:    WBC 11.1 (*)     Neutrophils Relative 85 (*)     Neutro Abs 9.4 (*)     Lymphocytes Relative 8 (*)     All other components within normal limits  COMPREHENSIVE METABOLIC PANEL - Abnormal; Notable for the following:    Sodium 134 (*)     Glucose, Bld 104 (*)     Albumin 3.4 (*)     GFR calc non Af Amer 82 (*)     All other components within normal limits  URINALYSIS, ROUTINE W REFLEX MICROSCOPIC - Abnormal; Notable for the following:    Color, Urine AMBER (*)  BIOCHEMICALS MAY BE AFFECTED BY COLOR   APPearance TURBID (*)     Specific Gravity, Urine 1.033 (*)     Bilirubin Urine SMALL (*)     Ketones, ur 40 (*)     Protein, ur 30 (*)     All other components within normal limits  URINE MICROSCOPIC-ADD ON - Abnormal; Notable for the following:    Bacteria, UA FEW (*)     All other components within normal limits  LIPASE, BLOOD  POCT I-STAT TROPONIN I   Ct Abdomen Pelvis W Contrast  10/30/2011  *RADIOLOGY REPORT*  Clinical Data: Right lower quadrant abdominal pain.  CT ABDOMEN AND PELVIS WITH CONTRAST  Technique:  Multidetector CT imaging of the abdomen and pelvis was performed following the standard protocol during bolus administration of intravenous contrast.  Contrast: OMNIPAQUE IOHEXOL 300 MG/ML  SOLN  Comparison: 10/10/2009  Findings: Atelectasis or infiltration in both lung bases.  The liver, spleen, gallbladder, pancreas, adrenal glands, kidneys, abdominal aorta, and retroperitoneal lymph nodes are unremarkable. There is focal wall thickening of the transverse limb of the hepatic flexure of the colon which is associated with a prominent diverticulum and pericolonic inflammatory stranding. This appearance suggests right-sided colonic diverticulitis.  However, given the focal wall thickening, an underlying colon cancer should be excluded.  Follow-up  evaluation of the colon after resolution of acute process is recommended.  The stomach and small bowel are decompressed.  There is no evidence of colonic obstruction.  No free air or free fluid in the abdomen.  Pelvis:  Nodular enlargement of the uterus consistent with fibroids, stable since previous study.  No adnexal masses.  Small amount of free fluid in the pelvis which may be physiologic or could arise from the inflammatory process previously discussed. Diverticulosis in the sigmoid colon without diverticulitis.  The appendix is normal.  No significant pelvic lymphadenopathy.  The bladder wall is not thickened.  Normal alignment of the lumbar vertebrae.  IMPRESSION: Inflammatory process in the right upper quadrant with associated focal wall thickening of the  transverse limb of the hepatic flexure of the colon.  Changes likely to represent right colonic diverticulitis although underlying colon cancer should be excluded.  Original Report Authenticated By: Marlon Pel, M.D.     No diagnosis found.    MDM   Will obtain CBC i-STAT catheter urine, and abdominal CT scan        Arman Filter, NP 10/30/11 [phone]

## 2011-10-30 NOTE — ED Provider Notes (Signed)
Medical screening examination/treatment/procedure(s) were performed by non-physician practitioner and as supervising physician I was immediately available for consultation/collaboration.   Batool Majid, MD 10/30/11 2253 

## 2011-10-31 ENCOUNTER — Encounter: Payer: Self-pay | Admitting: Internal Medicine

## 2011-10-31 NOTE — Assessment & Plan Note (Signed)
Continue with current prescription therapy as reflected on the Med list.  

## 2011-10-31 NOTE — Assessment & Plan Note (Signed)
Cont B12 

## 2011-10-31 NOTE — Progress Notes (Signed)
Patient ID: Teresa Hubbard, female   DOB: 12-16-1960, 51 y.o.   MRN: [id number]  Subjective:    Patient ID: Teresa Hubbard, female    DOB: 1960/04/16, 51 y.o.   MRN: [id number]  Abdominal Pain This is a new problem. The current episode started yesterday. The onset quality is gradual. The problem occurs intermittently. The problem has been waxing and waning. The pain is located in the RLQ. The pain is at a severity of 9/10. The pain is severe. The quality of the pain is sharp. The abdominal pain radiates to the periumbilical region and epigastric region. Pertinent negatives include no anorexia, diarrhea, dysuria, fever, frequency, headaches, hematuria, melena, myalgias, nausea, vomiting or weight loss. The pain is aggravated by certain positions. The pain is relieved by being still. She has tried acetaminophen for the symptoms. The treatment provided mild relief. Prior diagnostic workup includes CT scan. h/o diverticulitis     Wt Readings from Last 3 Encounters:  10/30/11 185 lb (83.915 kg)  10/29/11 195 lb (88.451 kg)  01/26/11 193 lb (87.544 kg)   BP Readings from Last 3 Encounters:  10/30/11 113/59  10/29/11 130/78  01/26/11 126/90      Review of Systems  Constitutional: Negative for fever, chills, weight loss, diaphoresis, activity change, appetite change, fatigue and unexpected weight change.  HENT: Negative for hearing loss, ear pain, congestion, sore throat, sneezing, mouth sores, neck pain, dental problem, voice change, postnasal drip and sinus pressure.   Eyes: Negative for pain and visual disturbance.  Respiratory: Negative for cough, chest tightness, wheezing and stridor.   Cardiovascular: Negative for chest pain, palpitations and leg swelling.  Gastrointestinal: Positive for abdominal pain. Negative for nausea, vomiting, diarrhea, blood in stool, melena, abdominal distention, rectal pain and anorexia.  Genitourinary: Negative for dysuria, frequency, hematuria, decreased urine  volume, vaginal bleeding, vaginal discharge, difficulty urinating, vaginal pain and menstrual problem.  Musculoskeletal: Negative for myalgias, back pain, joint swelling and gait problem.  Skin: Negative for color change, pallor, rash and wound.  Neurological: Negative for dizziness, tremors, syncope, speech difficulty, weakness, light-headedness, numbness and headaches.  Hematological: Negative for adenopathy.  Psychiatric/Behavioral: Negative for suicidal ideas, hallucinations, behavioral problems, confusion, disturbed wake/sleep cycle, dysphoric mood and decreased concentration. The patient is not nervous/anxious and is not hyperactive.        Objective:   Physical Exam  Constitutional: She appears well-developed and well-nourished. No distress.  HENT:  Head: Normocephalic.  Right Ear: External ear normal.  Left Ear: External ear normal.  Nose: Nose normal.  Mouth/Throat: Oropharynx is clear and moist.  Eyes: Conjunctivae are normal. Pupils are equal, round, and reactive to light. Right eye exhibits no discharge. Left eye exhibits no discharge.  Neck: Normal range of motion. Neck supple. No JVD present. No tracheal deviation present. No thyromegaly present.  Cardiovascular: Normal rate, regular rhythm and normal heart sounds.   Pulmonary/Chest: No stridor. No respiratory distress. She has no wheezes.  Abdominal: Soft. Bowel sounds are normal. She exhibits no distension and no mass. There is tenderness (RLQ mild; no rebound). There is no rebound and no guarding.  Musculoskeletal: She exhibits no edema and no tenderness.  Lymphadenopathy:    She has no cervical adenopathy.  Neurological: She displays normal reflexes. No cranial nerve deficit. She exhibits normal muscle tone. Coordination normal.  Skin: No rash noted. No erythema.  Psychiatric: She has a normal mood and affect. Her behavior is normal. Judgment and thought content normal.   Lab Results  Component  Value Date   WBC 11.1*  10/30/2011   HGB 13.4 10/30/2011   HCT 38.3 10/30/2011   PLT 218 10/30/2011   GLUCOSE 104* 10/30/2011   CHOL 263* 01/22/2011   TRIG 79.0 01/22/2011   HDL 55.70 01/22/2011   LDLDIRECT 204.9 01/22/2011   ALT 18 10/30/2011   AST 15 10/30/2011   NA 134* 10/30/2011   K 3.7 10/30/2011   CL 99 10/30/2011   CREATININE 0.82 10/30/2011   BUN 9 10/30/2011   CO2 23 10/30/2011   TSH 3.20 01/22/2011   PSA 0.00* 01/22/2011          Assessment & Plan:

## 2011-11-01 ENCOUNTER — Encounter: Payer: Self-pay | Admitting: Internal Medicine

## 2011-11-01 ENCOUNTER — Ambulatory Visit (INDEPENDENT_AMBULATORY_CARE_PROVIDER_SITE_OTHER): Payer: BC Managed Care – PPO | Admitting: Internal Medicine

## 2011-11-01 VITALS — BP 120/80 | HR 72 | Temp 98.2°F | Resp 16

## 2011-11-01 DIAGNOSIS — R5383 Other fatigue: Secondary | ICD-10-CM

## 2011-11-01 DIAGNOSIS — E538 Deficiency of other specified B group vitamins: Secondary | ICD-10-CM

## 2011-11-01 DIAGNOSIS — R5381 Other malaise: Secondary | ICD-10-CM

## 2011-11-01 DIAGNOSIS — K5792 Diverticulitis of intestine, part unspecified, without perforation or abscess without bleeding: Secondary | ICD-10-CM | POA: Insufficient documentation

## 2011-11-01 DIAGNOSIS — R109 Unspecified abdominal pain: Secondary | ICD-10-CM

## 2011-11-01 DIAGNOSIS — K5732 Diverticulitis of large intestine without perforation or abscess without bleeding: Secondary | ICD-10-CM

## 2011-11-01 MED ORDER — ALIGN 4 MG PO CAPS: 1.0000 | ORAL_CAPSULE | Freq: Every day | ORAL | Status: DC

## 2011-11-01 NOTE — Assessment & Plan Note (Addendum)
IMPRESSION:  Inflammatory process in the right upper quadrant with associated  focal wall thickening of the transverse limb of the hepatic flexure  of the colon. Changes likely to represent right colonic  diverticulitis although underlying colon cancer should be excluded.  Original Report Authenticated By: Marlon Pel, M.D.  Clinically better. Cont Cipro and Flagyl

## 2011-11-01 NOTE — Progress Notes (Signed)
Subjective:    Patient ID: Teresa Hubbard, female    DOB: 1960/10/16, 51 y.o.   MRN: [id number]  Abdominal Pain This is a new problem. The current episode started yesterday. The onset quality is gradual. The problem occurs intermittently. The problem has been waxing and waning. The pain is located in the RLQ. The pain is at a severity of 9/10. The pain is severe. The quality of the pain is sharp. The abdominal pain radiates to the periumbilical region and epigastric region. Pertinent negatives include no anorexia, diarrhea, dysuria, fever, frequency, headaches, hematuria, melena, myalgias, nausea, vomiting or weight loss. The pain is aggravated by certain positions. The pain is relieved by being still. She has tried acetaminophen for the symptoms. The treatment provided mild relief. Prior diagnostic workup includes CT scan. h/o diverticulitis   She went to ER on Fri, had a CT abd - better now. No fever, no N/V/D  Wt Readings from Last 3 Encounters:  10/30/11 185 lb (83.915 kg)  10/29/11 195 lb (88.451 kg)  01/26/11 193 lb (87.544 kg)   BP Readings from Last 3 Encounters:  11/01/11 120/80  10/30/11 113/59  10/29/11 130/78      Review of Systems  Constitutional: Negative for fever, chills, weight loss, diaphoresis, activity change, appetite change, fatigue and unexpected weight change.  HENT: Negative for hearing loss, ear pain, congestion, sore throat, sneezing, mouth sores, neck pain, dental problem, voice change, postnasal drip and sinus pressure.   Eyes: Negative for pain and visual disturbance.  Respiratory: Negative for cough, chest tightness, wheezing and stridor.   Cardiovascular: Negative for chest pain, palpitations and leg swelling.  Gastrointestinal: Positive for abdominal pain. Negative for nausea, vomiting, diarrhea, blood in stool, melena, abdominal distention, rectal pain and anorexia.  Genitourinary: Negative for dysuria, frequency, hematuria, decreased urine volume,  vaginal bleeding, vaginal discharge, difficulty urinating, vaginal pain and menstrual problem.  Musculoskeletal: Negative for myalgias, back pain, joint swelling and gait problem.  Skin: Negative for color change, pallor, rash and wound.  Neurological: Negative for dizziness, tremors, syncope, speech difficulty, weakness, light-headedness, numbness and headaches.  Hematological: Negative for adenopathy.  Psychiatric/Behavioral: Negative for suicidal ideas, hallucinations, behavioral problems, confusion, disturbed wake/sleep cycle, dysphoric mood and decreased concentration. The patient is not nervous/anxious and is not hyperactive.        Objective:   Physical Exam  Constitutional: She appears well-developed and well-nourished. No distress.  HENT:  Head: Normocephalic.  Right Ear: External ear normal.  Left Ear: External ear normal.  Nose: Nose normal.  Mouth/Throat: Oropharynx is clear and moist.  Eyes: Conjunctivae are normal. Pupils are equal, round, and reactive to light. Right eye exhibits no discharge. Left eye exhibits no discharge.  Neck: Normal range of motion. Neck supple. No JVD present. No tracheal deviation present. No thyromegaly present.  Cardiovascular: Normal rate, regular rhythm and normal heart sounds.   Pulmonary/Chest: No stridor. No respiratory distress. She has no wheezes.  Abdominal: Soft. Bowel sounds are normal. She exhibits no distension and no mass. There is no tenderness. There is no rebound and no guarding.  Musculoskeletal: She exhibits no edema and no tenderness.  Lymphadenopathy:    She has no cervical adenopathy.  Neurological: She displays normal reflexes. No cranial nerve deficit. She exhibits normal muscle tone. Coordination normal.  Skin: No rash noted. No erythema.  Psychiatric: She has a normal mood and affect. Her behavior is normal. Judgment and thought content normal.   Lab Results  Component Value Date  WBC 11.1* 10/30/2011   HGB 13.4  10/30/2011   HCT 38.3 10/30/2011   PLT 218 10/30/2011   GLUCOSE 104* 10/30/2011   CHOL 263* 01/22/2011   TRIG 79.0 01/22/2011   HDL 55.70 01/22/2011   LDLDIRECT 204.9 01/22/2011   ALT 18 10/30/2011   AST 15 10/30/2011   NA 134* 10/30/2011   K 3.7 10/30/2011   CL 99 10/30/2011   CREATININE 0.82 10/30/2011   BUN 9 10/30/2011   CO2 23 10/30/2011   TSH 3.20 01/22/2011   PSA 0.00* 01/22/2011    CT abd      Assessment & Plan:

## 2011-11-01 NOTE — Assessment & Plan Note (Signed)
  Clinically better. Cont Cipro and Flagyl Align x 3 wks GI cons Dr Christella Hartigan

## 2011-11-01 NOTE — Assessment & Plan Note (Signed)
Continue with current prescription therapy as reflected on the Med list.  

## 2011-11-01 NOTE — Assessment & Plan Note (Signed)
Better  

## 2011-11-02 ENCOUNTER — Telehealth: Payer: Self-pay | Admitting: Internal Medicine

## 2011-11-02 NOTE — Telephone Encounter (Signed)
Pt had CT ABDOMEN PELVIS W CONTRAST done sat @[facility]  long do you want pt to have this procedure done again

## 2011-11-02 NOTE — Telephone Encounter (Signed)
No. Pls cancel Thx

## 2011-11-26 ENCOUNTER — Encounter: Payer: Self-pay | Admitting: Gastroenterology

## 2011-11-26 ENCOUNTER — Ambulatory Visit (INDEPENDENT_AMBULATORY_CARE_PROVIDER_SITE_OTHER): Payer: BC Managed Care – PPO | Admitting: Gastroenterology

## 2011-11-26 VITALS — BP 100/60 | HR 88 | Ht 66.5 in | Wt 192.1 lb

## 2011-11-26 DIAGNOSIS — K5792 Diverticulitis of intestine, part unspecified, without perforation or abscess without bleeding: Secondary | ICD-10-CM

## 2011-11-26 DIAGNOSIS — K5732 Diverticulitis of large intestine without perforation or abscess without bleeding: Secondary | ICD-10-CM

## 2011-11-26 NOTE — Patient Instructions (Addendum)
Stop the align. Continue daily fiber supplements.   No food has been shown to make a difference with divertiulitis. Call if you have repeat abdominal pains. Will likely repeat CT scan and treat with Abx again.

## 2011-11-26 NOTE — Progress Notes (Signed)
Review of pertinent gastrointestinal problems: 1. Routine risk for colon cancer:   Colonoscopy 2011 found 3 small hyperplastic polyps as well as pan diverticulosis.  She was recommended to have repeat colonoscopy at 10 year interval 2. Diverticulosis, confirmed by colonoscopy 2011. 3. diverticulitis, hepatic flexure August 2013 confirmed by CT scan; this was mild, treated quite easily with oral antibiotics.  Clinically she had had diverticulitis in the sigmoid colon once or twice previously. No CT scan proof of this however.    HPI: This is a very pleasant 51 year old woman whom I last saw about 2 years ago.  Went to ER with RUQ pain, no fevers.  Workup below:  Ws put on antibiotics. Cipro/flagyl antibiotics.  Within 3 days her pains improved.  Since then she has been fine.  Quit coffee, quit wine.  Wbc 11.8K, left shift  IMPRESSION of CT scan earlier this month (i reviewed report and independently reviewed images):  Inflammatory process in the right upper quadrant with associated focal wall thickening of the transverse limb of the hepatic flexure of the colon. Changes likely to represent right colonic diverticulitis although underlying colon cancer should be excluded     Past Medical History  Diagnosis Date  . B12 DEFICIENCY 08/18/2007  . DIVERTICULOSIS, COLON 02/17/2007    History reviewed. No pertinent past surgical history.  Current Outpatient Prescriptions  Medication Sig Dispense Refill  . CYANOCOBALAMIN PO Take 1 each by mouth daily.       . Glucosamine-Chondroitin (GLUCOSAMINE CHONDR COMPLEX PO) Take 2 tablets by mouth daily.      . Probiotic Product (ALIGN) 4 MG CAPS Take 1 capsule by mouth daily.  30 capsule  1    Allergies as of 11/26/2011  . (No Known Allergies)    Family History  Problem Relation Age of Onset  . Ovarian cancer Maternal Aunt   . Lung cancer Maternal Uncle     History   Social History  . Marital Status: Married    Spouse Name: N/A    Number  of Children: 2  . Years of Education: N/A   Occupational History  . quality enginer    Social History Main Topics  . Smoking status: Never Smoker   . Smokeless tobacco: Never Used  . Alcohol Use: Yes     wine occasional  . Drug Use: No  . Sexually Active: Yes   Other Topics Concern  . Not on file   Social History Narrative  . No narrative on file      Physical Exam: BP 100/60  Pulse 88  Ht 5' 6.5" (1.689 m)  Wt 192 lb 2 oz (87.147 kg)  BMI 30.55 kg/m2  LMP 11/11/2011 Constitutional: generally well-appearing Psychiatric: alert and oriented x3 Abdomen: soft, nontender, nondistended, no obvious ascites, no peritoneal signs, normal bowel sounds     Assessment and plan: 51 y.o. female with recent diverticulitis  She has pan colonic diverticulosis. She has at least one clear episode of diverticulitis confirmed by imaging, this was in the hepatic flexure just recently. Clinically she has had diverticulitis at least twice in the left colon. Both of those were well treated with short course of antibiotics as well, prior to me meeting her. My plan is to follow her clinically and she knows to call here if she has repeat episode of significant abdominal pain. At that point I would like to repeat his CT scan to see if she is having recurrent episode of diverticulitis and its exact location. She knows  that this becomes a significant recurrent problem that she could be considered for surgical resection however need to nail down exactly where the infections are taking place otherwise it would be difficult to target surgery very specifically.

## 2012-03-01 ENCOUNTER — Ambulatory Visit (INDEPENDENT_AMBULATORY_CARE_PROVIDER_SITE_OTHER): Payer: BC Managed Care – PPO | Admitting: Internal Medicine

## 2012-03-01 ENCOUNTER — Encounter: Payer: Self-pay | Admitting: Internal Medicine

## 2012-03-01 VITALS — BP 118/68 | HR 72 | Temp 97.1°F | Resp 16 | Wt 198.0 lb

## 2012-03-01 DIAGNOSIS — J020 Streptococcal pharyngitis: Secondary | ICD-10-CM | POA: Insufficient documentation

## 2012-03-01 MED ORDER — AMOXICILLIN 875 MG PO TABS: 875.0000 mg | ORAL_TABLET | Freq: Two times a day (BID) | ORAL | Status: DC

## 2012-03-01 NOTE — Progress Notes (Signed)
Subjective:    Patient ID: Teresa Hubbard, female    DOB: 09/04/1960, 51 y.o.   MRN: [id number]  Sore Throat  This is a new problem. The current episode started in the past 7 days. The problem has been gradually improving. Neither side of throat is experiencing more pain than the other. The maximum temperature recorded prior to her arrival was 100 - 100.9 F. The fever has been present for 3 to 4 days. The pain is at a severity of 1/10. The pain is mild. Associated symptoms include swollen glands. Pertinent negatives include no abdominal pain, congestion, coughing, diarrhea, drooling, ear discharge, ear pain, headaches, hoarse voice, plugged ear sensation, neck pain, shortness of breath, stridor, trouble swallowing or vomiting. She has tried nothing for the symptoms.      Review of Systems  Constitutional: Positive for fever and chills. Negative for diaphoresis, activity change, appetite change, fatigue and unexpected weight change.  HENT: Positive for sore throat. Negative for ear pain, congestion, hoarse voice, drooling, mouth sores, trouble swallowing, neck pain, voice change, sinus pressure and ear discharge.   Eyes: Negative.   Respiratory: Negative for cough, chest tightness, shortness of breath and stridor.   Cardiovascular: Negative.   Gastrointestinal: Negative.  Negative for vomiting, abdominal pain and diarrhea.  Genitourinary: Negative.   Musculoskeletal: Negative for myalgias, back pain, joint swelling, arthralgias and gait problem.  Skin: Negative.   Neurological: Negative.  Negative for headaches.  Hematological: Positive for adenopathy. Does not bruise/bleed easily.  Psychiatric/Behavioral: Negative.        Objective:   Physical Exam  Vitals reviewed. Constitutional: She is oriented to person, place, and time. She appears well-developed and well-nourished.  Non-toxic appearance. She does not have a sickly appearance. She does not appear ill. No distress.  HENT:  Head:  Normocephalic and atraumatic. No trismus in the jaw.  Right Ear: Hearing, tympanic membrane, external ear and ear canal normal.  Left Ear: Hearing, tympanic membrane, external ear and ear canal normal.  Nose: Nose normal.  Mouth/Throat: Mucous membranes are normal. Mucous membranes are not pale, not dry and not cyanotic. No oral lesions. No uvula swelling. Posterior oropharyngeal erythema present. No oropharyngeal exudate, posterior oropharyngeal edema or tonsillar abscesses.  Eyes: Conjunctivae normal are normal. Right eye exhibits no discharge. Left eye exhibits no discharge. No scleral icterus.  Neck: Normal range of motion. Neck supple. No JVD present. No tracheal deviation present. No thyromegaly present.  Cardiovascular: Normal rate, regular rhythm, normal heart sounds and intact distal pulses.  Exam reveals no gallop and no friction rub.   No murmur heard. Pulmonary/Chest: Effort normal and breath sounds normal. No stridor. No respiratory distress. She has no wheezes. She has no rales. She exhibits no tenderness.  Abdominal: Soft. Bowel sounds are normal. She exhibits no distension and no mass. There is no tenderness. There is no rebound and no guarding.  Musculoskeletal: Normal range of motion. She exhibits no edema and no tenderness.  Lymphadenopathy:       Head (right side): No submental, no submandibular, no tonsillar, no preauricular, no posterior auricular and no occipital adenopathy present.       Head (left side): No submental, no submandibular, no tonsillar, no preauricular, no posterior auricular and no occipital adenopathy present.    She has cervical adenopathy.       Right cervical: Superficial cervical adenopathy present. No deep cervical and no posterior cervical adenopathy present.      Left cervical: Superficial cervical adenopathy present. No  deep cervical and no posterior cervical adenopathy present.    She has no axillary adenopathy.  Neurological: She is oriented to  person, place, and time.  Skin: Skin is warm and dry. No rash noted. She is not diaphoretic. No erythema. No pallor.  Psychiatric: She has a normal mood and affect. Her behavior is normal. Judgment and thought content normal.          Assessment & Plan:

## 2012-03-01 NOTE — Patient Instructions (Signed)

## 2012-03-01 NOTE — Assessment & Plan Note (Signed)
Will treat with amoxil She was given pt ed material

## 2012-06-06 ENCOUNTER — Encounter: Payer: Self-pay | Admitting: Internal Medicine

## 2012-06-06 ENCOUNTER — Other Ambulatory Visit (INDEPENDENT_AMBULATORY_CARE_PROVIDER_SITE_OTHER): Payer: BC Managed Care – PPO

## 2012-06-06 ENCOUNTER — Ambulatory Visit (INDEPENDENT_AMBULATORY_CARE_PROVIDER_SITE_OTHER): Payer: BC Managed Care – PPO | Admitting: Internal Medicine

## 2012-06-06 VITALS — BP 120/90 | HR 80 | Temp 97.3°F | Resp 16 | Wt 196.0 lb

## 2012-06-06 DIAGNOSIS — Z0000 Encounter for general adult medical examination without abnormal findings: Secondary | ICD-10-CM

## 2012-06-06 DIAGNOSIS — R5381 Other malaise: Secondary | ICD-10-CM

## 2012-06-06 DIAGNOSIS — R232 Flushing: Secondary | ICD-10-CM

## 2012-06-06 DIAGNOSIS — N951 Menopausal and female climacteric states: Secondary | ICD-10-CM

## 2012-06-06 DIAGNOSIS — E538 Deficiency of other specified B group vitamins: Secondary | ICD-10-CM

## 2012-06-06 DIAGNOSIS — R109 Unspecified abdominal pain: Secondary | ICD-10-CM

## 2012-06-06 DIAGNOSIS — E559 Vitamin D deficiency, unspecified: Secondary | ICD-10-CM

## 2012-06-06 DIAGNOSIS — K5732 Diverticulitis of large intestine without perforation or abscess without bleeding: Secondary | ICD-10-CM

## 2012-06-06 DIAGNOSIS — R5383 Other fatigue: Secondary | ICD-10-CM

## 2012-06-06 DIAGNOSIS — K5792 Diverticulitis of intestine, part unspecified, without perforation or abscess without bleeding: Secondary | ICD-10-CM

## 2012-06-06 LAB — URINALYSIS, ROUTINE W REFLEX MICROSCOPIC
Nitrite: NEGATIVE
Specific Gravity, Urine: 1.025 (ref 1.000–1.030)
Urobilinogen, UA: 0.2 (ref 0.0–1.0)
pH: 6 (ref 5.0–8.0)

## 2012-06-06 MED ORDER — CIPROFLOXACIN HCL 500 MG PO TABS: 500.0000 mg | ORAL_TABLET | Freq: Two times a day (BID) | ORAL | Status: DC

## 2012-06-06 NOTE — Progress Notes (Signed)
Subjective:    Abdominal Pain This is a recurrent problem. The current episode started in the past 7 days. The onset quality is sudden. The problem occurs constantly. The problem has been gradually improving. The pain is located in the RLQ, LLQ and epigastric region. The pain is at a severity of 4/10. The pain is mild. The quality of the pain is sharp. The abdominal pain radiates to the periumbilical region and epigastric region. Associated symptoms include constipation. Pertinent negatives include no anorexia, diarrhea, dysuria, fever, frequency, headaches, hematuria, melena, myalgias, nausea, vomiting or weight loss. She has tried acetaminophen for the symptoms. The treatment provided mild relief. Prior diagnostic workup includes CT scan. There is no history of ulcerative colitis. h/o diverticulitis  C/o hot flashes, irreg periods   Wt Readings from Last 3 Encounters:  06/06/12 196 lb (88.905 kg)  03/01/12 198 lb (89.812 kg)  11/26/11 192 lb 2 oz (87.147 kg)   BP Readings from Last 3 Encounters:  06/06/12 120/90  03/01/12 118/68  11/26/11 100/60      Review of Systems  Constitutional: Negative for fever, chills, weight loss, diaphoresis, activity change, appetite change, fatigue and unexpected weight change.  HENT: Negative for hearing loss, ear pain, congestion, sore throat, sneezing, mouth sores, neck pain, dental problem, voice change, postnasal drip and sinus pressure.   Eyes: Negative for pain and visual disturbance.  Respiratory: Negative for cough, chest tightness, wheezing and stridor.   Cardiovascular: Negative for chest pain, palpitations and leg swelling.  Gastrointestinal: Positive for abdominal pain and constipation. Negative for nausea, vomiting, diarrhea, blood in stool, melena, abdominal distention, rectal pain and anorexia.  Genitourinary: Negative for dysuria, frequency, hematuria, decreased urine volume, vaginal bleeding, vaginal discharge, difficulty urinating,  vaginal pain and menstrual problem.  Musculoskeletal: Negative for myalgias, back pain, joint swelling and gait problem.  Skin: Negative for color change, pallor, rash and wound.  Neurological: Negative for dizziness, tremors, syncope, speech difficulty, weakness, light-headedness, numbness and headaches.  Hematological: Negative for adenopathy.  Psychiatric/Behavioral: Negative for suicidal ideas, hallucinations, behavioral problems, confusion, sleep disturbance, dysphoric mood and decreased concentration. The patient is not nervous/anxious and is not hyperactive.        Objective:   Physical Exam  Constitutional: She appears well-developed and well-nourished. No distress.  HENT:  Head: Normocephalic.  Right Ear: External ear normal.  Left Ear: External ear normal.  Nose: Nose normal.  Mouth/Throat: Oropharynx is clear and moist.  Eyes: Conjunctivae are normal. Pupils are equal, round, and reactive to light. Right eye exhibits no discharge. Left eye exhibits no discharge.  Neck: Normal range of motion. Neck supple. No JVD present. No tracheal deviation present. No thyromegaly present.  Cardiovascular: Normal rate, regular rhythm and normal heart sounds.   Pulmonary/Chest: No stridor. No respiratory distress. She has no wheezes.  Abdominal: Soft. Bowel sounds are normal. She exhibits no distension and no mass. There is tenderness (RLQ and LLQ - mild; no rebound). There is no rebound and no guarding.  Musculoskeletal: She exhibits no edema and no tenderness.  Lymphadenopathy:    She has no cervical adenopathy.  Neurological: She displays normal reflexes. No cranial nerve deficit. She exhibits normal muscle tone. Coordination normal.  Skin: No rash noted. No erythema.  Psychiatric: She has a normal mood and affect. Her behavior is normal. Judgment and thought content normal.   Lab Results  Component Value Date   WBC 11.1* 10/30/2011   HGB 13.4 10/30/2011   HCT 38.3 10/30/2011   PLT  218  10/30/2011   GLUCOSE 104* 10/30/2011   CHOL 263* 01/22/2011   TRIG 79.0 01/22/2011   HDL 55.70 01/22/2011   LDLDIRECT 204.9 01/22/2011   ALT 18 10/30/2011   AST 15 10/30/2011   NA 134* 10/30/2011   K 3.7 10/30/2011   CL 99 10/30/2011   CREATININE 0.82 10/30/2011   BUN 9 10/30/2011   CO2 23 10/30/2011   TSH 3.20 01/22/2011   PSA 0.00* 01/22/2011          Assessment & Plan:

## 2012-06-06 NOTE — Assessment & Plan Note (Signed)
R/o divrticulitis

## 2012-06-06 NOTE — Assessment & Plan Note (Signed)
314

## 2012-06-07 ENCOUNTER — Telehealth: Payer: Self-pay | Admitting: Internal Medicine

## 2012-06-07 ENCOUNTER — Encounter: Payer: Self-pay | Admitting: Internal Medicine

## 2012-06-07 DIAGNOSIS — R232 Flushing: Secondary | ICD-10-CM | POA: Insufficient documentation

## 2012-06-07 MED ORDER — VITAMIN B-12 1000 MCG SL SUBL: 1.0000 | SUBLINGUAL_TABLET | Freq: Every day | SUBLINGUAL | Status: DC

## 2012-06-07 MED ORDER — VITAMIN D 1000 UNITS PO TABS: 1000.0000 [IU] | ORAL_TABLET | Freq: Every day | ORAL | Status: AC

## 2012-06-07 NOTE — Assessment & Plan Note (Signed)
Start Cipro 

## 2012-06-07 NOTE — Assessment & Plan Note (Signed)
Continue with current  therapy as reflected on the Med list.  

## 2012-06-07 NOTE — Telephone Encounter (Signed)
Pt informed

## 2012-06-07 NOTE — Telephone Encounter (Signed)
Misty Stanley, please, inform patient that her UA was nl. Take abx as we discussed Thx

## 2012-06-07 NOTE — Assessment & Plan Note (Signed)
3/14 likely hormonal New - irreg periods  [facility] appt

## 2012-10-09 IMAGING — CT CT ABD-PELV W/ CM
1 of 3 series · 14 of 32 positions shown, 19 images · IV contrast (OMNIPAQUE 300)
Comparison: 10/10/2009

CLINICAL DATA: Right lower quadrant abdominal pain.

CT ABDOMEN AND PELVIS WITH CONTRAST
TECHNIQUE: Multidetector CT imaging of the abdomen and pelvis was
performed following the standard protocol during bolus
administration of intravenous contrast.
Contrast: 100mL OMNIPAQUE IOHEXOL 300 MG/ML  SOLN

[Series 2: abd/pel with · axial · 0.80mm/px · z∈[-453,-38]mm · 14 of 93 slices shown, 19 images]
[im 5/93  soft-tissue]
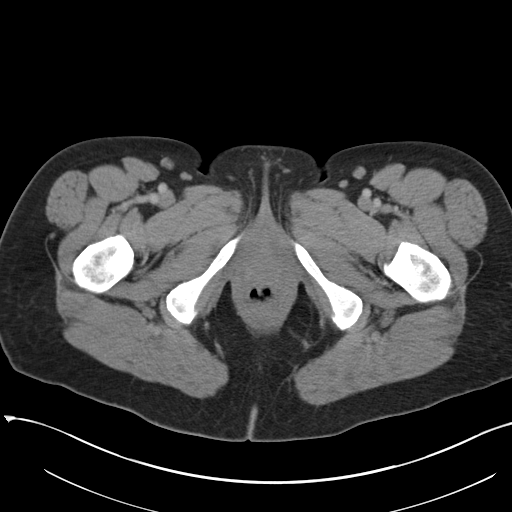
[im 5/93  bone]
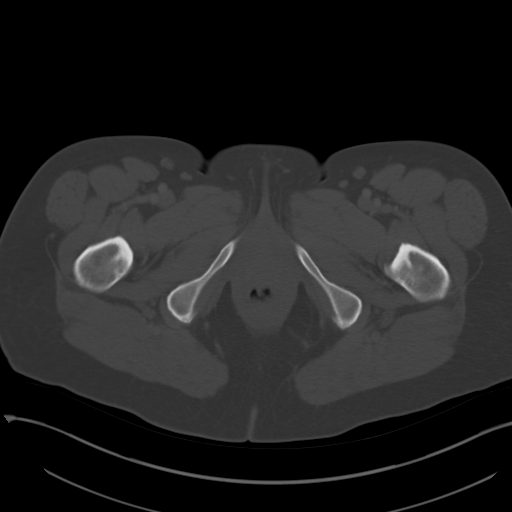
[im 14/93  soft-tissue]
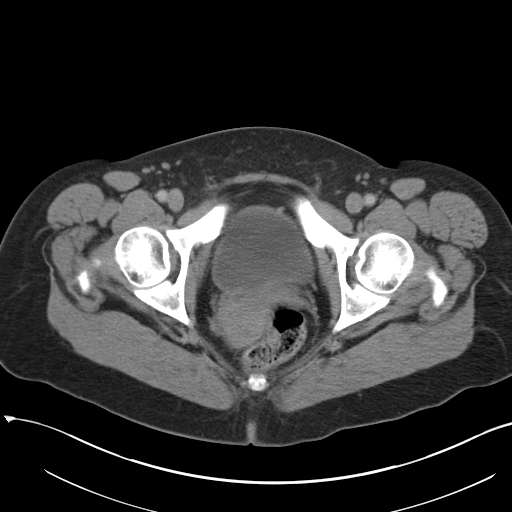
[im 19/93  soft-tissue]
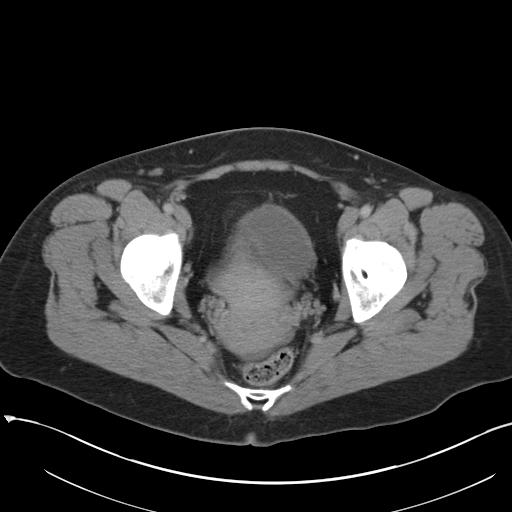
[im 28/93  soft-tissue]
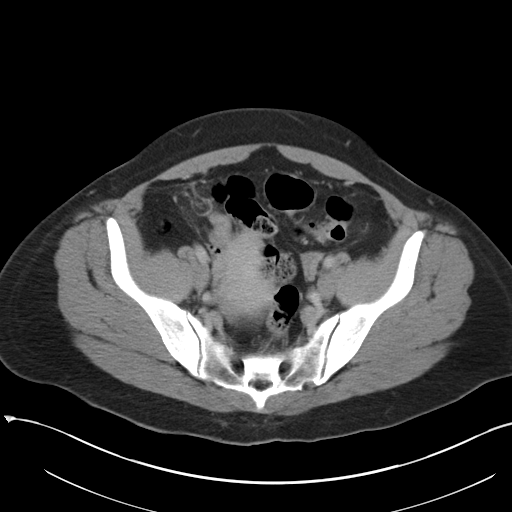
[im 33/93  soft-tissue]
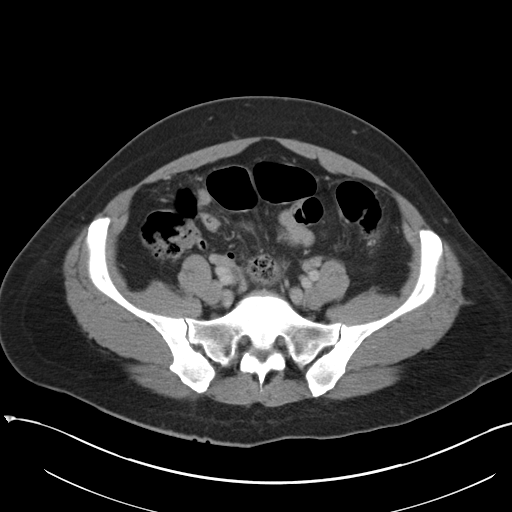
[im 42/93  soft-tissue]
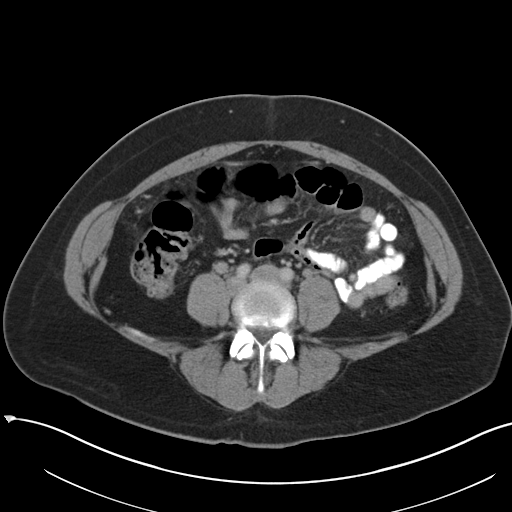
[im 47/93  soft-tissue]
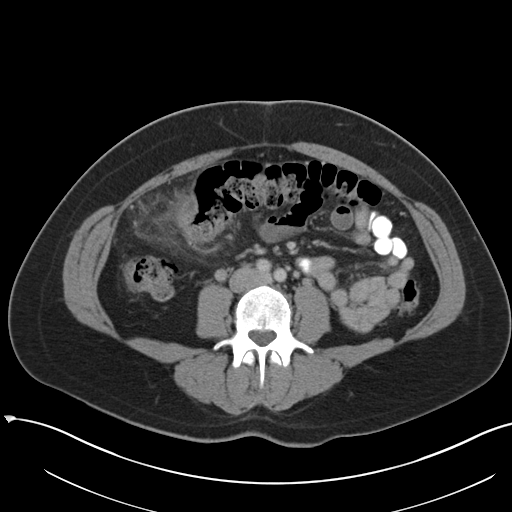
[im 51/93  soft-tissue]
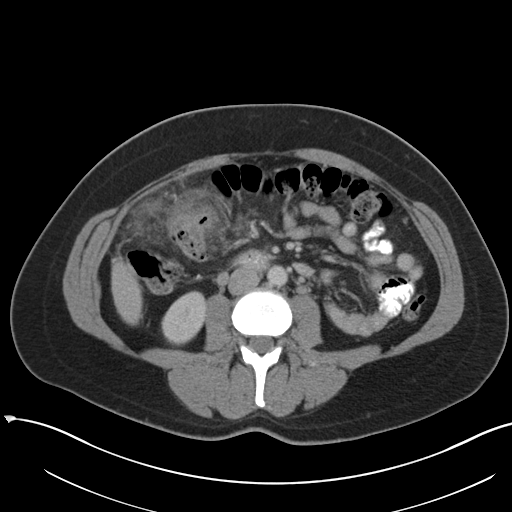
[im 60/93  soft-tissue]
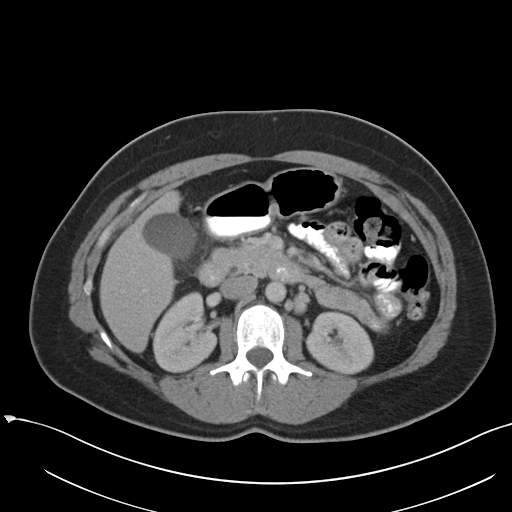
[im 60/93  bone]
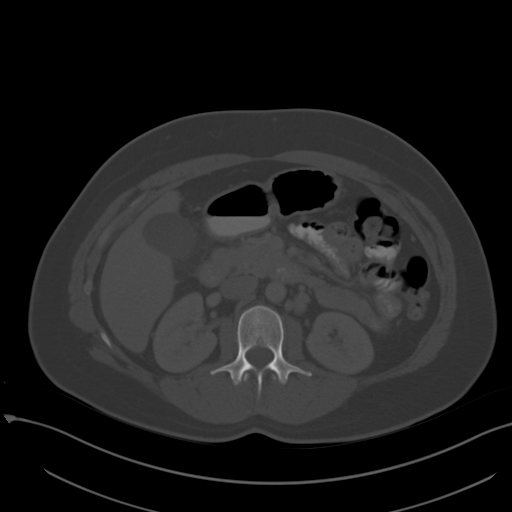
[im 65/93  soft-tissue]
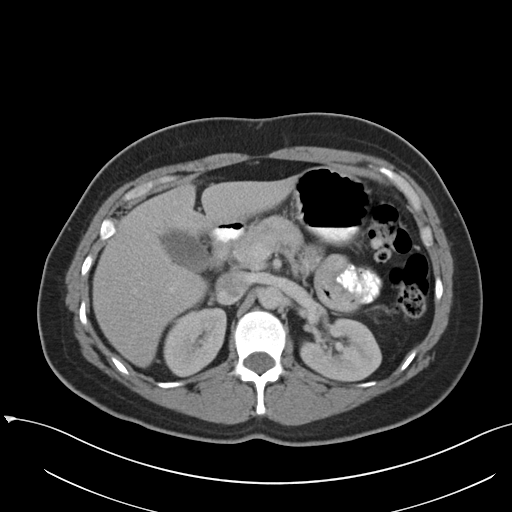
[im 74/93  soft-tissue]
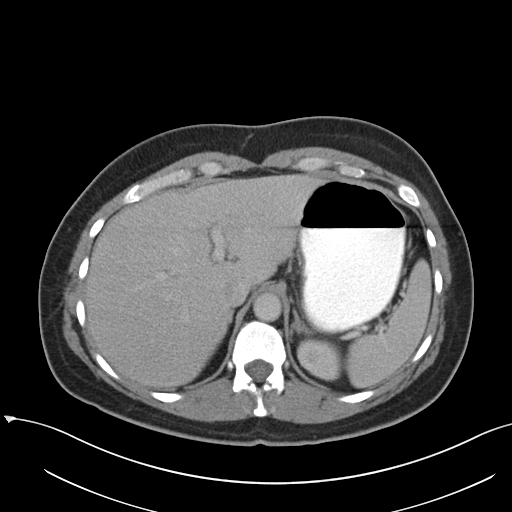
[im 74/93  lung]
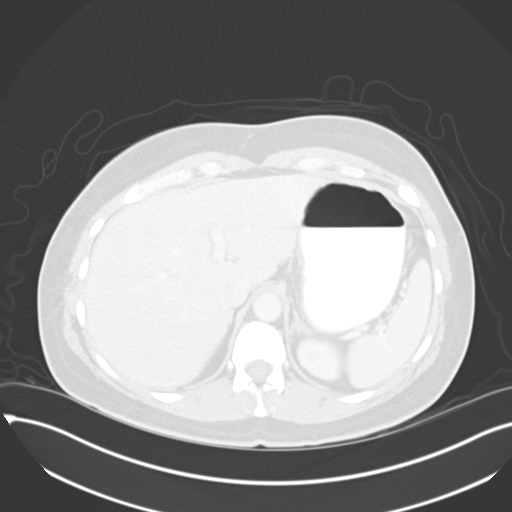
[im 79/93  soft-tissue]
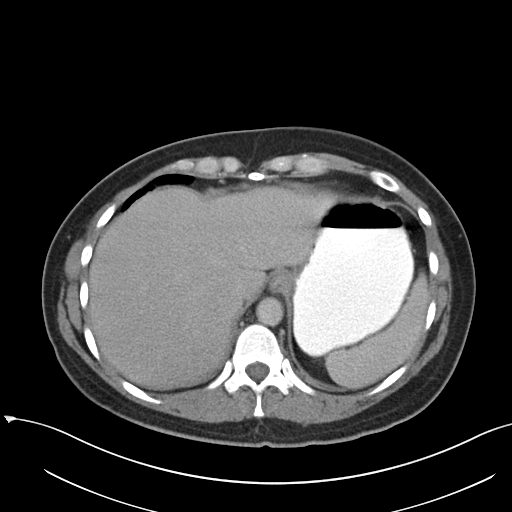
[im 79/93  lung]
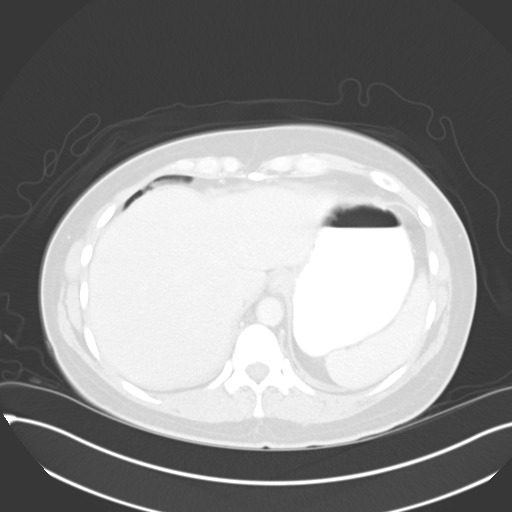
[im 83/93  lung]
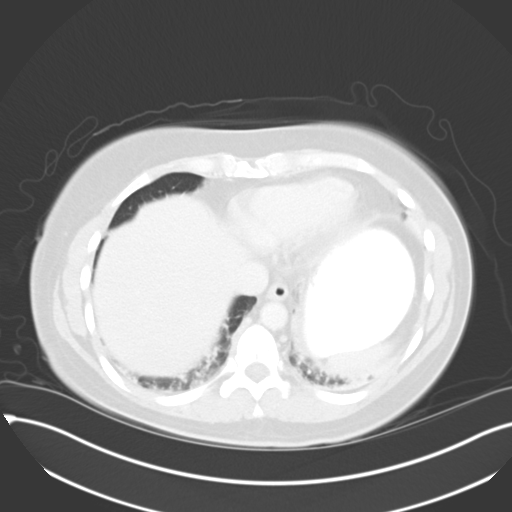
[im 88/93  soft-tissue]
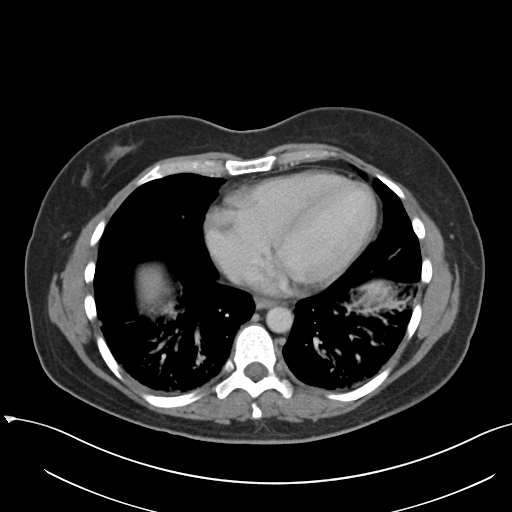
[im 88/93  lung]
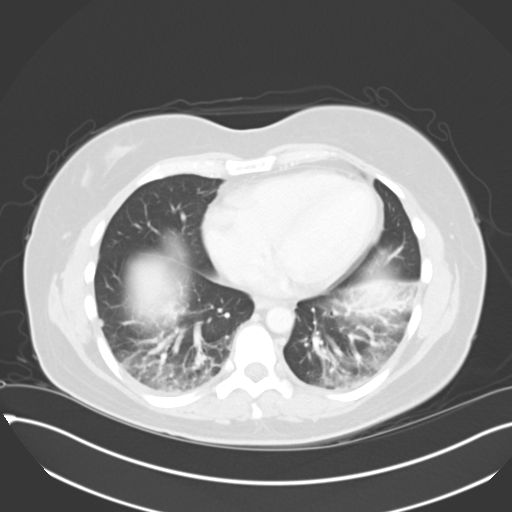

[14 of 32 positions shown; findings below may reference images not displayed]

FINDINGS: Atelectasis or infiltration in both lung bases.

The liver, spleen, gallbladder, pancreas, adrenal glands, kidneys,
abdominal aorta, and retroperitoneal lymph nodes are unremarkable.
There is focal wall thickening of the transverse limb of the
hepatic flexure of the colon which is associated with a prominent
diverticulum and pericolonic inflammatory stranding. This
appearance suggests right-sided colonic diverticulitis.  However,
given the focal wall thickening, an underlying colon cancer should
be excluded.  Follow-up evaluation of the colon after resolution of
acute process is recommended.  The stomach and small bowel are
decompressed.  There is no evidence of colonic obstruction.  No
free air or free fluid in the abdomen.

Pelvis:  Nodular enlargement of the uterus consistent with
fibroids, stable since previous study.  No adnexal masses.  Small
amount of free fluid in the pelvis which may be physiologic or
could arise from the inflammatory process previously discussed.
Diverticulosis in the sigmoid colon without diverticulitis.  The
appendix is normal.  No significant pelvic lymphadenopathy.  The
bladder wall is not thickened.  Normal alignment of the lumbar
vertebrae.
IMPRESSION: Inflammatory process in the right upper quadrant with associated
focal wall thickening of the transverse limb of the hepatic flexure
of the colon.  Changes likely to represent right colonic
diverticulitis although underlying colon cancer should be excluded.

## 2012-11-09 ENCOUNTER — Encounter: Payer: Self-pay | Admitting: Internal Medicine

## 2013-03-23 ENCOUNTER — Encounter: Payer: BC Managed Care – PPO | Admitting: Internal Medicine

## 2013-03-26 ENCOUNTER — Ambulatory Visit (INDEPENDENT_AMBULATORY_CARE_PROVIDER_SITE_OTHER): Payer: BC Managed Care – PPO | Admitting: Internal Medicine

## 2013-03-26 ENCOUNTER — Encounter: Payer: Self-pay | Admitting: Internal Medicine

## 2013-03-26 VITALS — BP 108/76 | HR 76 | Temp 98.8°F | Resp 16 | Ht 67.0 in | Wt 204.0 lb

## 2013-03-26 DIAGNOSIS — E538 Deficiency of other specified B group vitamins: Secondary | ICD-10-CM

## 2013-03-26 DIAGNOSIS — K5732 Diverticulitis of large intestine without perforation or abscess without bleeding: Secondary | ICD-10-CM

## 2013-03-26 DIAGNOSIS — K5792 Diverticulitis of intestine, part unspecified, without perforation or abscess without bleeding: Secondary | ICD-10-CM

## 2013-03-26 DIAGNOSIS — Z23 Encounter for immunization: Secondary | ICD-10-CM

## 2013-03-26 DIAGNOSIS — E785 Hyperlipidemia, unspecified: Secondary | ICD-10-CM

## 2013-03-26 DIAGNOSIS — Z0000 Encounter for general adult medical examination without abnormal findings: Secondary | ICD-10-CM

## 2013-03-26 NOTE — Progress Notes (Signed)
Pre visit review using our clinic review tool, if applicable. No additional management support is needed unless otherwise documented below in the visit note. 

## 2013-03-26 NOTE — Assessment & Plan Note (Signed)
Diet 

## 2013-03-26 NOTE — Patient Instructions (Signed)
Gluten free trial (no wheat products) for 4-6 weeks. OK to use gluten-free bread and gluten-free pasta.  Milk free trial (no milk, ice cream, cheese and yogurt) for 4-6 weeks. OK to use almond, coconut, rice or soy milk. "Almond breeze" brand tastes good.  

## 2013-03-26 NOTE — Progress Notes (Signed)
   Subjective:    HPI  The patient is here for a wellness exam. The patient has been doing well overall without major physical or psychological issues going on lately. C/o wt gain  Wt Readings from Last 3 Encounters:  03/26/13 204 lb (92.534 kg)  06/06/12 196 lb (88.905 kg)  03/01/12 198 lb (89.812 kg)   BP Readings from Last 3 Encounters:  03/26/13 108/76  06/06/12 120/90  03/01/12 118/68    Review of Systems  Constitutional: Negative for fever, chills, diaphoresis, activity change, appetite change, fatigue and unexpected weight change.  HENT: Negative for congestion, dental problem, ear pain, hearing loss, mouth sores, postnasal drip, sinus pressure, sneezing, sore throat and voice change.   Eyes: Negative for pain and visual disturbance.  Respiratory: Negative for cough, chest tightness, wheezing and stridor.   Cardiovascular: Negative for chest pain, palpitations and leg swelling.  Gastrointestinal: Negative for nausea, vomiting, abdominal pain, blood in stool, abdominal distention and rectal pain.  Genitourinary: Negative for dysuria, frequency, hematuria, decreased urine volume, vaginal bleeding, vaginal discharge, difficulty urinating, vaginal pain and menstrual problem.  Musculoskeletal: Negative for back pain, gait problem, joint swelling and neck pain.  Skin: Negative for color change, pallor, rash and wound.  Neurological: Negative for dizziness, tremors, syncope, speech difficulty, weakness, light-headedness, numbness and headaches.  Hematological: Negative for adenopathy.  Psychiatric/Behavioral: Negative for suicidal ideas, hallucinations, behavioral problems, confusion, sleep disturbance, dysphoric mood and decreased concentration. The patient is not nervous/anxious and is not hyperactive.        Objective:   Physical Exam  Constitutional: She appears well-developed and well-nourished. No distress.  HENT:  Head: Normocephalic.  Right Ear: External ear normal.   Left Ear: External ear normal.  Nose: Nose normal.  Mouth/Throat: Oropharynx is clear and moist.  Eyes: Conjunctivae are normal. Pupils are equal, round, and reactive to light. Right eye exhibits no discharge. Left eye exhibits no discharge.  Neck: Normal range of motion. Neck supple. No JVD present. No tracheal deviation present. No thyromegaly present.  Cardiovascular: Normal rate, regular rhythm and normal heart sounds.   Pulmonary/Chest: No stridor. No respiratory distress. She has no wheezes.  Abdominal: Soft. Bowel sounds are normal. She exhibits no distension and no mass. There is no tenderness. There is no rebound and no guarding.  Musculoskeletal: She exhibits no edema and no tenderness.  Lymphadenopathy:    She has no cervical adenopathy.  Neurological: She displays normal reflexes. No cranial nerve deficit. She exhibits normal muscle tone. Coordination normal.  Skin: No rash noted. No erythema.  Psychiatric: She has a normal mood and affect. Her behavior is normal. Judgment and thought content normal.   Lab Results  Component Value Date   WBC 11.1* 10/30/2011   HGB 13.4 10/30/2011   HCT 38.3 10/30/2011   PLT 218 10/30/2011   GLUCOSE 104* 10/30/2011   CHOL 263* 01/22/2011   TRIG 79.0 01/22/2011   HDL 55.70 01/22/2011   LDLDIRECT 204.9 01/22/2011   ALT 18 10/30/2011   AST 15 10/30/2011   NA 134* 10/30/2011   K 3.7 10/30/2011   CL 99 10/30/2011   CREATININE 0.82 10/30/2011   BUN 9 10/30/2011   CO2 23 10/30/2011   TSH 3.20 01/22/2011   PSA 0.00* 01/22/2011          Assessment & Plan:

## 2013-03-26 NOTE — Assessment & Plan Note (Signed)
Continue with current prescription therapy as reflected on the Med list.  

## 2013-03-27 ENCOUNTER — Other Ambulatory Visit (INDEPENDENT_AMBULATORY_CARE_PROVIDER_SITE_OTHER): Payer: BC Managed Care – PPO

## 2013-03-27 DIAGNOSIS — K5732 Diverticulitis of large intestine without perforation or abscess without bleeding: Secondary | ICD-10-CM

## 2013-03-27 DIAGNOSIS — E538 Deficiency of other specified B group vitamins: Secondary | ICD-10-CM

## 2013-03-27 DIAGNOSIS — K5792 Diverticulitis of intestine, part unspecified, without perforation or abscess without bleeding: Secondary | ICD-10-CM

## 2013-03-27 DIAGNOSIS — Z0000 Encounter for general adult medical examination without abnormal findings: Secondary | ICD-10-CM

## 2013-03-27 DIAGNOSIS — E785 Hyperlipidemia, unspecified: Secondary | ICD-10-CM

## 2013-03-27 DIAGNOSIS — Z23 Encounter for immunization: Secondary | ICD-10-CM

## 2013-03-27 LAB — CBC WITH DIFFERENTIAL/PLATELET
Basophils Absolute: 0.1 10*3/uL (ref 0.0–0.1)
Eosinophils Absolute: 0.2 10*3/uL (ref 0.0–0.7)
HCT: 43.9 % (ref 36.0–46.0)
Hemoglobin: 15 g/dL (ref 12.0–15.0)
Lymphocytes Relative: 22.6 % (ref 12.0–46.0)
Lymphs Abs: 1.6 10*3/uL (ref 0.7–4.0)
MCHC: 34.1 g/dL (ref 30.0–36.0)
Monocytes Relative: 6.6 % (ref 3.0–12.0)
Neutro Abs: 4.6 10*3/uL (ref 1.4–7.7)
Platelets: 257 10*3/uL (ref 150.0–400.0)
RDW: 12.6 % (ref 11.5–14.6)

## 2013-03-27 LAB — URINALYSIS, ROUTINE W REFLEX MICROSCOPIC
Bilirubin Urine: NEGATIVE
Hgb urine dipstick: NEGATIVE
Nitrite: NEGATIVE
Specific Gravity, Urine: >=1.03 — AB (ref 1.000–1.030)
Total Protein, Urine: NEGATIVE
Urine Glucose: NEGATIVE
pH: 5.5 (ref 5.0–8.0)

## 2013-03-27 LAB — BASIC METABOLIC PANEL
BUN: 13 mg/dL (ref 6–23)
CO2: 28 mEq/L (ref 19–32)
Calcium: 9.3 mg/dL (ref 8.4–10.5)
GFR: 62.67 mL/min (ref >60.00)
Glucose, Bld: 91 mg/dL (ref 70–99)
Sodium: 138 mEq/L (ref 135–145)

## 2013-03-27 LAB — LIPID PANEL
Cholesterol: 291 mg/dL — ABNORMAL HIGH (ref 0–200)
HDL: 49.4 mg/dL (ref >39.00)
Triglycerides: 137 mg/dL (ref 0.0–149.0)

## 2013-03-27 LAB — HEPATIC FUNCTION PANEL
AST: 20 U/L (ref 0–37)
Albumin: 4.2 g/dL (ref 3.5–5.2)
Total Bilirubin: 0.7 mg/dL (ref 0.3–1.2)

## 2013-03-27 LAB — LDL CHOLESTEROL, DIRECT: Direct LDL: 226 mg/dL

## 2013-03-28 ENCOUNTER — Other Ambulatory Visit: Payer: Self-pay | Admitting: Internal Medicine

## 2013-03-28 MED ORDER — CIPROFLOXACIN HCL 250 MG PO TABS: 250.0000 mg | ORAL_TABLET | Freq: Two times a day (BID) | ORAL | Status: DC

## 2013-04-01 ENCOUNTER — Other Ambulatory Visit: Payer: Self-pay | Admitting: Internal Medicine

## 2013-04-01 MED ORDER — ATORVASTATIN CALCIUM 10 MG PO TABS: 10.0000 mg | ORAL_TABLET | Freq: Every day | ORAL | Status: DC

## 2013-12-18 ENCOUNTER — Encounter: Payer: Self-pay | Admitting: Nurse Practitioner

## 2013-12-18 ENCOUNTER — Ambulatory Visit (INDEPENDENT_AMBULATORY_CARE_PROVIDER_SITE_OTHER): Payer: BC Managed Care – PPO | Admitting: Nurse Practitioner

## 2013-12-18 VITALS — BP 139/90 | HR 62 | Temp 98.2°F | Ht 67.0 in | Wt 201.0 lb

## 2013-12-18 DIAGNOSIS — R1032 Left lower quadrant pain: Secondary | ICD-10-CM

## 2013-12-18 DIAGNOSIS — J3089 Other allergic rhinitis: Secondary | ICD-10-CM

## 2013-12-18 DIAGNOSIS — K5732 Diverticulitis of large intestine without perforation or abscess without bleeding: Secondary | ICD-10-CM

## 2013-12-18 MED ORDER — FLUTICASONE PROPIONATE 50 MCG/ACT NA SUSP: 1.0000 | Freq: Two times a day (BID) | NASAL | Status: DC

## 2013-12-18 MED ORDER — CIPROFLOXACIN HCL 500 MG PO TABS: 500.0000 mg | ORAL_TABLET | Freq: Two times a day (BID) | ORAL | Status: DC

## 2013-12-18 NOTE — Progress Notes (Signed)
Pre visit review using our clinic review tool, if applicable. No additional management support is needed unless otherwise documented below in the visit note. 

## 2013-12-18 NOTE — Patient Instructions (Signed)
Rest, Adequate fluid intake.  Clear to bland diet Go to ER if pain worsens or develop fever, chills, nausea, vomiting or stool becomes dark tarry.  Follow up with MD in 7-1- days Call clinic with questions or concerns.   Ear wax removal kit over the counter.  Start Flonase nasal spray over the counter use as indicated and needed.  Diverticulitis Diverticulitis is inflammation or infection of small pouches in your colon that form when you have a condition called diverticulosis. The pouches in your colon are called diverticula. Your colon, or large intestine, is where water is absorbed and stool is formed. Complications of diverticulitis can include:  Bleeding.  Severe infection.  Severe pain.  Perforation of your colon.  Obstruction of your colon. CAUSES  Diverticulitis is caused by bacteria. Diverticulitis happens when stool becomes trapped in diverticula. This allows bacteria to grow in the diverticula, which can lead to inflammation and infection. RISK FACTORS People with diverticulosis are at risk for diverticulitis. Eating a diet that does not include enough fiber from fruits and vegetables may make diverticulitis more likely to develop. SYMPTOMS  Symptoms of diverticulitis may include:  Abdominal pain and tenderness. The pain is normally located on the left side of the abdomen, but may occur in other areas.  Fever and chills.  Bloating.  Cramping.  Nausea.  Vomiting.  Constipation.  Diarrhea.  Blood in your stool. DIAGNOSIS  Your health care provider will ask you about your medical history and do a physical exam. You may need to have tests done because many medical conditions can cause the same symptoms as diverticulitis. Tests may include:  Blood tests.  Urine tests.  Imaging tests of the abdomen, including X-rays and CT scans. When your condition is under control, your health care provider may recommend that you have a colonoscopy. A colonoscopy can show  how severe your diverticula are and whether something else is causing your symptoms. TREATMENT  Most cases of diverticulitis are mild and can be treated at home. Treatment may include:  Taking over-the-counter pain medicines.  Following a clear liquid diet.  Taking antibiotic medicines by mouth for 7-10 days. More severe cases may be treated at a hospital. Treatment may include:  Not eating or drinking.  Taking prescription pain medicine.  Receiving antibiotic medicines through an IV tube.  Receiving fluids and nutrition through an IV tube.  Surgery. HOME CARE INSTRUCTIONS   Follow your health care provider's instructions carefully.  Follow a full liquid diet or other diet as directed by your health care provider. After your symptoms improve, your health care provider may tell you to change your diet. He or she may recommend you eat a high-fiber diet. Fruits and vegetables are good sources of fiber. Fiber makes it easier to pass stool.  Take fiber supplements or probiotics as directed by your health care provider.  Only take medicines as directed by your health care provider.  Keep all your follow-up appointments. SEEK MEDICAL CARE IF:   Your pain does not improve.  You have a hard time eating food.  Your bowel movements do not return to normal. SEEK IMMEDIATE MEDICAL CARE IF:   Your pain becomes worse.  Your symptoms do not get better.  Your symptoms suddenly get worse.  You have a fever.  You have repeated vomiting.  You have bloody or black, tarry stools. MAKE SURE YOU:   Understand these instructions.  Will watch your condition.  Will get help right away if you are not  doing well or get worse. Document Released: 12/23/2004 Document Revised: 03/20/2013 Document Reviewed: 02/07/2013 [facility] Patient Information 2015 [place], [place]. This information is not intended to replace advice given to you by your health care provider. Make sure you discuss any  questions you have with your health care provider.

## 2013-12-18 NOTE — Progress Notes (Signed)
Subjective:    Patient ID: Teresa Hubbard, female    DOB: 1960-06-23, 53 y.o.   MRN: [id number]  HPI  Patient is seen for 4-5 day history of lower abdominal pain over right and left quadrants.  Sharp pains at times. Increased bowel sounds and belching.  Patient reports pain level 7/10, 2 days ago, now 4/10 pain.  Complains of abdominal bloating diarrhea 2 days ago, now stool formed but small amounts. Appetitie less.  Drinking a lot of water. Denies pain with urination. Denies fever, nausea, vomiting, or dark tarry stools. Eating a light diet.     Review of Systems  Constitutional: Positive for chills and appetite change. Negative for fever and diaphoresis.       Appetitie less,  Patient eating a light diet.  HENT: Positive for congestion and ear pain.        Complains of ear pressure, and headache, some post nasal drip  Respiratory: Negative for cough, chest tightness and shortness of breath.   Cardiovascular: Negative for chest pain and palpitations.  Gastrointestinal: Positive for abdominal pain and abdominal distention. Negative for nausea, vomiting, diarrhea, constipation and blood in stool.  Genitourinary: Negative for dysuria, urgency, flank pain, vaginal pain, menstrual problem and pelvic pain.       LMP 9/15  Skin: Negative.    Past Medical History  Diagnosis Date  . B12 DEFICIENCY 08/18/2007  . DIVERTICULOSIS, COLON 02/17/2007    History   Social History  . Marital Status: Married    Spouse Name: N/A    Number of Children: 2  . Years of Education: N/A   Occupational History  . quality enginer    Social History Main Topics  . Smoking status: Never Smoker   . Smokeless tobacco: Never Used  . Alcohol Use: No     Comment: wine occasional  . Drug Use: No  . Sexual Activity: Yes   Other Topics Concern  . Not on file   Social History Narrative  . No narrative on file    No past surgical history on file.  Family History  Problem Relation Age of Onset  . Ovarian  cancer Maternal Aunt   . Lung cancer Maternal Uncle     No Known Allergies  Current Outpatient Prescriptions on File Prior to Visit  Medication Sig Dispense Refill  . atorvastatin (LIPITOR) 10 MG tablet Take 1 tablet (10 mg total) by mouth daily.  30 tablet  11  . Cyanocobalamin (VITAMIN B-12) 1000 MCG SUBL Place 1 tablet (1,000 mcg total) under the tongue daily.  100 tablet  3   No current facility-administered medications on file prior to visit.    BP 139/90  Pulse 62  Temp(Src) 98.2 F (36.8 C) (Oral)  Ht 5' 7"  (1.702 m)  Wt 201 lb (91.173 kg)  BMI 31.47 kg/m2  SpO2 98%        Objective:   Physical Exam  Constitutional: She is oriented to person, place, and time. She appears well-developed and well-nourished. No distress.  Patient states pain has improved since 2 days ago.   HENT:  Head: Normocephalic.  Right Ear: External ear normal.  Left Ear: External ear normal.  Mouth/Throat: Oropharynx is clear and moist.  Ears with moderate amount of cerumen right greater than left.  Oropharynx without erythma, moderate to large amount of clear to whitish drainage noted posteriorly  Cardiovascular: Normal rate, regular rhythm and normal heart sounds.   Pulmonary/Chest: Effort normal and breath sounds normal.  Abdominal: Soft. She exhibits distension. She exhibits no mass. There is tenderness. There is no rebound and no guarding.  Bowel sounds hyperactive throughout.  Moderate amount of tenderness on palpation over lower abdomen. Left greater than right.  Neurological: She is alert and oriented to person, place, and time.  Skin: Skin is warm and dry.  Psychiatric: She has a normal mood and affect.          Assessment & Plan:  1. Abdominal pain, left lower quadrant  2. Diverticulitis of colon (without mention of hemorrhage) Clear liquid to bland diet. No meat. Start Ciprofloxin 500 mg po bid. Rest . Adequate fluid intake.  Go to ER if symptoms worsen or develops a fever,  chills,  nausea, vomiting or has dark tarry stool.  Follow up in 7 days for re evaluation or sooner if indicated.  Patient instructed to call clinic if symptoms change.    3. Other allergic rhinitis Ear wax removal kit OTC Start Flonase nasal spray OTC take as indicated.  Call clinic with questions or concerns.  Patient declines influenza vaccine

## 2014-01-11 ENCOUNTER — Other Ambulatory Visit: Payer: Self-pay

## 2014-03-28 ENCOUNTER — Ambulatory Visit (INDEPENDENT_AMBULATORY_CARE_PROVIDER_SITE_OTHER): Payer: BC Managed Care – PPO | Admitting: Internal Medicine

## 2014-03-28 ENCOUNTER — Other Ambulatory Visit (INDEPENDENT_AMBULATORY_CARE_PROVIDER_SITE_OTHER): Payer: BC Managed Care – PPO

## 2014-03-28 ENCOUNTER — Encounter: Payer: Self-pay | Admitting: Internal Medicine

## 2014-03-28 VITALS — BP 130/90 | HR 50 | Temp 98.4°F | Ht 67.0 in | Wt 199.0 lb

## 2014-03-28 DIAGNOSIS — E559 Vitamin D deficiency, unspecified: Secondary | ICD-10-CM

## 2014-03-28 DIAGNOSIS — Z0000 Encounter for general adult medical examination without abnormal findings: Secondary | ICD-10-CM

## 2014-03-28 DIAGNOSIS — E538 Deficiency of other specified B group vitamins: Secondary | ICD-10-CM

## 2014-03-28 DIAGNOSIS — E785 Hyperlipidemia, unspecified: Secondary | ICD-10-CM

## 2014-03-28 LAB — VITAMIN D 25 HYDROXY (VIT D DEFICIENCY, FRACTURES): VITD: 23.29 ng/mL — ABNORMAL LOW (ref 30.00–100.00)

## 2014-03-28 LAB — HEPATIC FUNCTION PANEL
ALT: 33 U/L (ref 0–35)
AST: 19 U/L (ref 0–37)
Albumin: 3.8 g/dL (ref 3.5–5.2)
Alkaline Phosphatase: 35 U/L — ABNORMAL LOW (ref 39–117)
Bilirubin, Direct: 0.1 mg/dL (ref 0.0–0.3)
Total Bilirubin: 0.6 mg/dL (ref 0.2–1.2)
Total Protein: 6.9 g/dL (ref 6.0–8.3)

## 2014-03-28 LAB — BASIC METABOLIC PANEL
BUN: 13 mg/dL (ref 6–23)
CALCIUM: 9 mg/dL (ref 8.4–10.5)
CO2: 26 meq/L (ref 19–32)
Chloride: 106 mEq/L (ref 96–112)
Creatinine, Ser: 0.9 mg/dL (ref 0.4–1.2)
GFR: 69.68 mL/min (ref >60.00)
Glucose, Bld: 95 mg/dL (ref 70–99)
Potassium: 4.5 mEq/L (ref 3.5–5.1)
SODIUM: 138 meq/L (ref 135–145)

## 2014-03-28 LAB — CBC WITH DIFFERENTIAL/PLATELET
BASOS PCT: 1 % (ref 0.0–3.0)
Basophils Absolute: 0.1 10*3/uL (ref 0.0–0.1)
Eosinophils Absolute: 0.2 10*3/uL (ref 0.0–0.7)
Eosinophils Relative: 3.8 % (ref 0.0–5.0)
HEMATOCRIT: 43.2 % (ref 36.0–46.0)
Hemoglobin: 14.3 g/dL (ref 12.0–15.0)
Lymphocytes Relative: 24 % (ref 12.0–46.0)
Lymphs Abs: 1.4 10*3/uL (ref 0.7–4.0)
MCHC: 33.2 g/dL (ref 30.0–36.0)
MCV: 90.8 fl (ref 78.0–100.0)
MONO ABS: 0.4 10*3/uL (ref 0.1–1.0)
MONOS PCT: 7.3 % (ref 3.0–12.0)
Neutro Abs: 3.6 10*3/uL (ref 1.4–7.7)
Neutrophils Relative %: 63.9 % (ref 43.0–77.0)
Platelets: 251 10*3/uL (ref 150.0–400.0)
RBC: 4.76 Mil/uL (ref 3.87–5.11)
RDW: 13.7 % (ref 11.5–15.5)
WBC: 5.7 10*3/uL (ref 4.0–10.5)

## 2014-03-28 LAB — URINALYSIS
BILIRUBIN URINE: NEGATIVE
Hgb urine dipstick: NEGATIVE
Ketones, ur: NEGATIVE
Leukocytes, UA: NEGATIVE
Nitrite: NEGATIVE
Specific Gravity, Urine: 1.025 (ref 1.000–1.030)
TOTAL PROTEIN, URINE-UPE24: NEGATIVE
URINE GLUCOSE: NEGATIVE
Urobilinogen, UA: 0.2 (ref 0.0–1.0)
pH: 6 (ref 5.0–8.0)

## 2014-03-28 LAB — LIPID PANEL
CHOL/HDL RATIO: 5
Cholesterol: 248 mg/dL — ABNORMAL HIGH (ref 0–200)
HDL: 50.6 mg/dL (ref >39.00)
LDL Cholesterol: 174 mg/dL — ABNORMAL HIGH (ref 0–99)
NONHDL: 197.4
Triglycerides: 117 mg/dL (ref 0.0–149.0)
VLDL: 23.4 mg/dL (ref 0.0–40.0)

## 2014-03-28 LAB — TSH: TSH: 3.07 u[IU]/mL (ref 0.35–4.50)

## 2014-03-28 LAB — VITAMIN B12: Vitamin B-12: 508 pg/mL (ref 211–911)

## 2014-03-28 MED ORDER — VITAMIN D 1000 UNITS PO TABS: 1000.0000 [IU] | ORAL_TABLET | Freq: Every day | ORAL | Status: DC

## 2014-03-28 NOTE — Patient Instructions (Signed)
Low carb diet 

## 2014-03-28 NOTE — Assessment & Plan Note (Addendum)
We discussed age appropriate health related issues, including available/recomended screening tests and vaccinations. We discussed a need for adhering to healthy diet and exercise. Labs/EKG were reviewed/ordered. All questions were answered.  Parents are alive - 21 and 10 Opth exam q 12 mo GYN q 12 mo Mammo q 2 years Pt declined shots

## 2014-03-28 NOTE — Assessment & Plan Note (Signed)
Continue with current prescription therapy as reflected on the Med list.  

## 2014-03-28 NOTE — Assessment & Plan Note (Signed)
On med Labs

## 2014-03-28 NOTE — Progress Notes (Signed)
   Subjective:    HPI  The patient is here for a wellness exam. The patient has been doing well overall without major physical or psychological issues going on lately.  Wt Readings from Last 3 Encounters:  03/28/14 199 lb (90.266 kg)  12/18/13 201 lb (91.173 kg)  03/26/13 204 lb (92.534 kg)   BP Readings from Last 3 Encounters:  03/28/14 130/90  12/18/13 139/90  03/26/13 108/76    Review of Systems  Constitutional: Negative for fever, chills, diaphoresis, activity change, appetite change, fatigue and unexpected weight change.  HENT: Negative for congestion, dental problem, ear pain, hearing loss, mouth sores, postnasal drip, sinus pressure, sneezing, sore throat and voice change.   Eyes: Negative for pain and visual disturbance.  Respiratory: Negative for cough, chest tightness, wheezing and stridor.   Cardiovascular: Negative for chest pain, palpitations and leg swelling.  Gastrointestinal: Negative for nausea, vomiting, abdominal pain, blood in stool, abdominal distention and rectal pain.  Genitourinary: Negative for dysuria, frequency, hematuria, decreased urine volume, vaginal bleeding, vaginal discharge, difficulty urinating, vaginal pain and menstrual problem.  Musculoskeletal: Negative for back pain, joint swelling, gait problem and neck pain.  Skin: Negative for color change, pallor, rash and wound.  Neurological: Negative for dizziness, tremors, syncope, speech difficulty, weakness, light-headedness, numbness and headaches.  Hematological: Negative for adenopathy.  Psychiatric/Behavioral: Negative for suicidal ideas, hallucinations, behavioral problems, confusion, sleep disturbance, dysphoric mood and decreased concentration. The patient is not nervous/anxious and is not hyperactive.        Objective:   Physical Exam  Constitutional: She appears well-developed. No distress.  HENT:  Head: Normocephalic.  Right Ear: External ear normal.  Left Ear: External ear normal.   Nose: Nose normal.  Mouth/Throat: Oropharynx is clear and moist.  Eyes: Conjunctivae are normal. Pupils are equal, round, and reactive to light. Right eye exhibits no discharge. Left eye exhibits no discharge.  Neck: Normal range of motion. Neck supple. No JVD present. No tracheal deviation present. No thyromegaly present.  Cardiovascular: Normal rate, regular rhythm and normal heart sounds.   Pulmonary/Chest: No stridor. No respiratory distress. She has no wheezes.  Abdominal: Soft. Bowel sounds are normal. She exhibits no distension and no mass. There is no tenderness. There is no rebound and no guarding.  Musculoskeletal: She exhibits no edema or tenderness.  Lymphadenopathy:    She has no cervical adenopathy.  Neurological: She displays normal reflexes. No cranial nerve deficit. She exhibits normal muscle tone. Coordination normal.  Skin: No rash noted. No erythema.  Psychiatric: She has a normal mood and affect. Her behavior is normal. Judgment and thought content normal.   Lab Results  Component Value Date   WBC 6.9 03/27/2013   HGB 15.0 03/27/2013   HCT 43.9 03/27/2013   PLT 257.0 03/27/2013   GLUCOSE 91 03/27/2013   CHOL 291* 03/27/2013   TRIG 137.0 03/27/2013   HDL 49.40 03/27/2013   LDLDIRECT 226.0 03/27/2013   ALT 29 03/27/2013   AST 20 03/27/2013   NA 138 03/27/2013   K 4.3 03/27/2013   CL 105 03/27/2013   CREATININE 1.0 03/27/2013   BUN 13 03/27/2013   CO2 28 03/27/2013   TSH 4.48 03/27/2013   PSA 0.00* 01/22/2011          Assessment & Plan:

## 2014-03-29 MED ORDER — VITAMIN D 1000 UNITS PO TABS: 2000.0000 [IU] | ORAL_TABLET | Freq: Every day | ORAL | Status: AC

## 2014-04-10 ENCOUNTER — Other Ambulatory Visit: Payer: Self-pay | Admitting: Internal Medicine

## 2014-08-05 ENCOUNTER — Telehealth: Payer: Self-pay | Admitting: Internal Medicine

## 2014-08-05 MED ORDER — ATORVASTATIN CALCIUM 10 MG PO TABS: 10.0000 mg | ORAL_TABLET | Freq: Every day | ORAL | Status: DC

## 2014-08-05 NOTE — Telephone Encounter (Signed)
Patient is out of lipitor.  States [facility] in [place] has tried to fax for a week.

## 2014-08-05 NOTE — Telephone Encounter (Signed)
Rf sent. Pt's husband informed.

## 2014-08-05 NOTE — Telephone Encounter (Signed)
error 

## 2014-12-06 ENCOUNTER — Encounter: Payer: Self-pay | Admitting: Gastroenterology

## 2015-02-27 LAB — HM MAMMOGRAPHY

## 2015-06-20 ENCOUNTER — Ambulatory Visit (INDEPENDENT_AMBULATORY_CARE_PROVIDER_SITE_OTHER): Payer: 59 | Admitting: Internal Medicine

## 2015-06-20 ENCOUNTER — Other Ambulatory Visit (INDEPENDENT_AMBULATORY_CARE_PROVIDER_SITE_OTHER): Payer: 59

## 2015-06-20 ENCOUNTER — Encounter: Payer: Self-pay | Admitting: Internal Medicine

## 2015-06-20 VITALS — BP 124/80 | HR 72 | Wt 206.0 lb

## 2015-06-20 DIAGNOSIS — Z0000 Encounter for general adult medical examination without abnormal findings: Secondary | ICD-10-CM

## 2015-06-20 DIAGNOSIS — E538 Deficiency of other specified B group vitamins: Secondary | ICD-10-CM

## 2015-06-20 DIAGNOSIS — E669 Obesity, unspecified: Secondary | ICD-10-CM

## 2015-06-20 DIAGNOSIS — E559 Vitamin D deficiency, unspecified: Secondary | ICD-10-CM | POA: Diagnosis not present

## 2015-06-20 LAB — URINALYSIS, ROUTINE W REFLEX MICROSCOPIC
Bilirubin Urine: NEGATIVE
Hgb urine dipstick: NEGATIVE
KETONES UR: NEGATIVE
NITRITE: NEGATIVE
PH: 6 (ref 5.0–8.0)
SPECIFIC GRAVITY, URINE: 1.015 (ref 1.000–1.030)
Total Protein, Urine: NEGATIVE
URINE GLUCOSE: NEGATIVE
Urobilinogen, UA: 0.2 (ref 0.0–1.0)

## 2015-06-20 LAB — LIPID PANEL
Cholesterol: 217 mg/dL — ABNORMAL HIGH (ref 0–200)
HDL: 55.6 mg/dL (ref >39.00)
LDL CALC: 132 mg/dL — AB (ref 0–99)
NonHDL: 161.29
Total CHOL/HDL Ratio: 4
Triglycerides: 148 mg/dL (ref 0.0–149.0)
VLDL: 29.6 mg/dL (ref 0.0–40.0)

## 2015-06-20 LAB — COMPREHENSIVE METABOLIC PANEL
ALBUMIN: 4.4 g/dL (ref 3.5–5.2)
ALK PHOS: 33 U/L — AB (ref 39–117)
ALT: 36 U/L — ABNORMAL HIGH (ref 0–35)
AST: 20 U/L (ref 0–37)
BUN: 17 mg/dL (ref 6–23)
CALCIUM: 9.4 mg/dL (ref 8.4–10.5)
CHLORIDE: 104 meq/L (ref 96–112)
CO2: 28 mEq/L (ref 19–32)
CREATININE: 0.92 mg/dL (ref 0.40–1.20)
GFR: 67.62 mL/min (ref >60.00)
Glucose, Bld: 99 mg/dL (ref 70–99)
POTASSIUM: 4 meq/L (ref 3.5–5.1)
SODIUM: 139 meq/L (ref 135–145)
TOTAL PROTEIN: 7.5 g/dL (ref 6.0–8.3)
Total Bilirubin: 0.4 mg/dL (ref 0.2–1.2)

## 2015-06-20 LAB — CBC WITH DIFFERENTIAL/PLATELET
BASOS PCT: 1.6 % (ref 0.0–3.0)
Basophils Absolute: 0.1 10*3/uL (ref 0.0–0.1)
EOS ABS: 0.3 10*3/uL (ref 0.0–0.7)
Eosinophils Relative: 4.6 % (ref 0.0–5.0)
HEMATOCRIT: 42.6 % (ref 36.0–46.0)
HEMOGLOBIN: 14.6 g/dL (ref 12.0–15.0)
LYMPHS PCT: 37 % (ref 12.0–46.0)
Lymphs Abs: 2 10*3/uL (ref 0.7–4.0)
MCHC: 34.2 g/dL (ref 30.0–36.0)
MCV: 89.4 fl (ref 78.0–100.0)
MONOS PCT: 7.2 % (ref 3.0–12.0)
Monocytes Absolute: 0.4 10*3/uL (ref 0.1–1.0)
Neutro Abs: 2.7 10*3/uL (ref 1.4–7.7)
Neutrophils Relative %: 49.6 % (ref 43.0–77.0)
Platelets: 266 10*3/uL (ref 150.0–400.0)
RBC: 4.77 Mil/uL (ref 3.87–5.11)
RDW: 13.5 % (ref 11.5–15.5)
WBC: 5.4 10*3/uL (ref 4.0–10.5)

## 2015-06-20 LAB — VITAMIN B12: VITAMIN B 12: 1091 pg/mL — AB (ref 211–911)

## 2015-06-20 LAB — TSH: TSH: 2.63 u[IU]/mL (ref 0.35–4.50)

## 2015-06-20 LAB — VITAMIN D 25 HYDROXY (VIT D DEFICIENCY, FRACTURES): VITD: 38.59 ng/mL (ref 30.00–100.00)

## 2015-06-20 MED ORDER — LORCASERIN HCL ER 20 MG PO TB24: 1.0000 | ORAL_TABLET | Freq: Every morning | ORAL | Status: DC

## 2015-06-20 MED ORDER — ATORVASTATIN CALCIUM 10 MG PO TABS: 10.0000 mg | ORAL_TABLET | Freq: Every day | ORAL | Status: DC

## 2015-06-20 MED ORDER — AMOXICILLIN-POT CLAVULANATE 875-125 MG PO TABS: 1.0000 | ORAL_TABLET | Freq: Two times a day (BID) | ORAL | Status: DC

## 2015-06-20 NOTE — Progress Notes (Signed)
Subjective:  Patient ID: Teresa Hubbard, female    DOB: 1960/05/16  Age: 55 y.o. MRN: [id number]  CC: Annual Exam   HPI Teresa Hubbard presents for a well exam C/o being overweight  Outpatient Prescriptions Prior to Visit  Medication Sig Dispense Refill  . atorvastatin (LIPITOR) 10 MG tablet Take 1 tablet (10 mg total) by mouth daily. 30 tablet 11  . Cyanocobalamin (VITAMIN B-12) 1000 MCG SUBL Place 1 tablet (1,000 mcg total) under the tongue daily. 100 tablet 3  . fluticasone (FLONASE ALLERGY RELIEF) 50 MCG/ACT nasal spray Place 1 spray into both nostrils 2 (two) times daily. 9.9 g 0  . ciprofloxacin (CIPRO) 500 MG tablet Take 1 tablet (500 mg total) by mouth 2 (two) times daily. (Patient not taking: Reported on 06/20/2015) 20 tablet 0   No facility-administered medications prior to visit.    ROS Review of Systems  Constitutional: Negative for chills, activity change, appetite change, fatigue and unexpected weight change.  HENT: Negative for congestion, mouth sores and sinus pressure.   Eyes: Negative for visual disturbance.  Respiratory: Negative for cough and chest tightness.   Gastrointestinal: Negative for nausea and abdominal pain.  Genitourinary: Negative for frequency, difficulty urinating and vaginal pain.  Musculoskeletal: Negative for back pain and gait problem.  Skin: Negative for pallor and rash.  Neurological: Negative for dizziness, tremors, weakness, numbness and headaches.  Psychiatric/Behavioral: Negative for suicidal ideas, confusion and sleep disturbance.    Objective:  BP 124/80 mmHg  Pulse 72  Wt 206 lb (93.441 kg)  SpO2 94%  BP Readings from Last 3 Encounters:  06/20/15 124/80  03/28/14 130/90  12/18/13 139/90    Wt Readings from Last 3 Encounters:  06/20/15 206 lb (93.441 kg)  03/28/14 199 lb (90.266 kg)  12/18/13 201 lb (91.173 kg)    Physical Exam  Constitutional: She appears well-developed. No distress.  HENT:  Head: Normocephalic.    Right Ear: External ear normal.  Left Ear: External ear normal.  Nose: Nose normal.  Mouth/Throat: Oropharynx is clear and moist.  Eyes: Conjunctivae are normal. Pupils are equal, round, and reactive to light. Right eye exhibits no discharge. Left eye exhibits no discharge.  Neck: Normal range of motion. Neck supple. No JVD present. No tracheal deviation present. No thyromegaly present.  Cardiovascular: Normal rate, regular rhythm and normal heart sounds.   Pulmonary/Chest: No stridor. No respiratory distress. She has no wheezes.  Abdominal: Soft. Bowel sounds are normal. She exhibits no distension and no mass. There is no tenderness. There is no rebound and no guarding.  Musculoskeletal: She exhibits no edema or tenderness.  Lymphadenopathy:    She has no cervical adenopathy.  Neurological: She displays normal reflexes. No cranial nerve deficit. She exhibits normal muscle tone. Coordination normal.  Skin: No rash noted. No erythema.  Psychiatric: She has a normal mood and affect. Her behavior is normal. Judgment and thought content normal.    Lab Results  Component Value Date   WBC 5.7 03/28/2014   HGB 14.3 03/28/2014   HCT 43.2 03/28/2014   PLT 251.0 03/28/2014   GLUCOSE 95 03/28/2014   CHOL 248* 03/28/2014   TRIG 117.0 03/28/2014   HDL 50.60 03/28/2014   LDLDIRECT 226.0 03/27/2013   LDLCALC 174* 03/28/2014   ALT 33 03/28/2014   AST 19 03/28/2014   NA 138 03/28/2014   K 4.5 03/28/2014   CL 106 03/28/2014   CREATININE 0.9 03/28/2014   BUN 13 03/28/2014   CO2 26  03/28/2014   TSH 3.07 03/28/2014   PSA 0.00* 01/22/2011    Ct Abdomen Pelvis W Contrast  10/30/2011  *RADIOLOGY REPORT* Clinical Data: Right lower quadrant abdominal pain. CT ABDOMEN AND PELVIS WITH CONTRAST Technique:  Multidetector CT imaging of the abdomen and pelvis was performed following the standard protocol during bolus administration of intravenous contrast. Contrast: 172mL OMNIPAQUE IOHEXOL 300 MG/ML  SOLN  Comparison: 10/10/2009 Findings: Atelectasis or infiltration in both lung bases. The liver, spleen, gallbladder, pancreas, adrenal glands, kidneys, abdominal aorta, and retroperitoneal lymph nodes are unremarkable. There is focal wall thickening of the transverse limb of the hepatic flexure of the colon which is associated with a prominent diverticulum and pericolonic inflammatory stranding. This appearance suggests right-sided colonic diverticulitis.  However, given the focal wall thickening, an underlying colon cancer should be excluded.  Follow-up evaluation of the colon after resolution of acute process is recommended.  The stomach and small bowel are decompressed.  There is no evidence of colonic obstruction.  No free air or free fluid in the abdomen. Pelvis:  Nodular enlargement of the uterus consistent with fibroids, stable since previous study.  No adnexal masses.  Small amount of free fluid in the pelvis which may be physiologic or could arise from the inflammatory process previously discussed. Diverticulosis in the sigmoid colon without diverticulitis.  The appendix is normal.  No significant pelvic lymphadenopathy.  The bladder wall is not thickened.  Normal alignment of the lumbar vertebrae. IMPRESSION: Inflammatory process in the right upper quadrant with associated focal wall thickening of the transverse limb of the hepatic flexure of the colon.  Changes likely to represent right colonic diverticulitis although underlying colon cancer should be excluded. Original Report Authenticated By: Neale Burly, M.D.   Assessment & Plan:   There are no diagnoses linked to this encounter. I have discontinued Ms. Filan ciprofloxacin. I am also having her maintain her Vitamin B-12, fluticasone, and atorvastatin.  No orders of the defined types were placed in this encounter.     Follow-up: No Follow-up on file.  Walker Kehr, MD

## 2015-06-20 NOTE — Progress Notes (Signed)
Pre visit review using our clinic review tool, if applicable. No additional management support is needed unless otherwise documented below in the visit note. 

## 2015-06-21 DIAGNOSIS — E669 Obesity, unspecified: Secondary | ICD-10-CM | POA: Insufficient documentation

## 2015-06-21 NOTE — Assessment & Plan Note (Signed)
We discussed age appropriate health related issues, including available/recomended screening tests and vaccinations. We discussed a need for adhering to healthy diet and exercise. Labs/EKG were reviewed/ordered. All questions were answered.   

## 2015-06-21 NOTE — Assessment & Plan Note (Signed)
Mild 3/17 we can try Beviq XR

## 2015-09-02 ENCOUNTER — Other Ambulatory Visit (INDEPENDENT_AMBULATORY_CARE_PROVIDER_SITE_OTHER): Payer: 59

## 2015-09-02 ENCOUNTER — Encounter: Payer: Self-pay | Admitting: Internal Medicine

## 2015-09-02 ENCOUNTER — Ambulatory Visit (INDEPENDENT_AMBULATORY_CARE_PROVIDER_SITE_OTHER): Payer: 59 | Admitting: Internal Medicine

## 2015-09-02 ENCOUNTER — Ambulatory Visit (INDEPENDENT_AMBULATORY_CARE_PROVIDER_SITE_OTHER)
Admission: RE | Admit: 2015-09-02 | Discharge: 2015-09-02 | Disposition: A | Discharge status: Home / Self Care | Payer: 59 | Source: Ambulatory Visit | Attending: Internal Medicine | Admitting: Internal Medicine

## 2015-09-02 VITALS — BP 120/70 | HR 73 | Temp 98.3°F | Resp 16 | Ht 67.0 in | Wt 205.0 lb

## 2015-09-02 DIAGNOSIS — R10814 Left lower quadrant abdominal tenderness: Secondary | ICD-10-CM | POA: Diagnosis not present

## 2015-09-02 DIAGNOSIS — B029 Zoster without complications: Secondary | ICD-10-CM | POA: Diagnosis not present

## 2015-09-02 DIAGNOSIS — K5732 Diverticulitis of large intestine without perforation or abscess without bleeding: Secondary | ICD-10-CM | POA: Diagnosis not present

## 2015-09-02 LAB — CBC WITH DIFFERENTIAL/PLATELET
BASOS ABS: 0.1 10*3/uL (ref 0.0–0.1)
Basophils Relative: 0.6 % (ref 0.0–3.0)
EOS ABS: 0.3 10*3/uL (ref 0.0–0.7)
Eosinophils Relative: 3 % (ref 0.0–5.0)
HCT: 43.2 % (ref 36.0–46.0)
Hemoglobin: 14.7 g/dL (ref 12.0–15.0)
LYMPHS ABS: 1.3 10*3/uL (ref 0.7–4.0)
Lymphocytes Relative: 15 % (ref 12.0–46.0)
MCHC: 34.1 g/dL (ref 30.0–36.0)
MCV: 89 fl (ref 78.0–100.0)
Monocytes Absolute: 0.7 10*3/uL (ref 0.1–1.0)
Monocytes Relative: 8.2 % (ref 3.0–12.0)
NEUTROS ABS: 6.3 10*3/uL (ref 1.4–7.7)
NEUTROS PCT: 73.2 % (ref 43.0–77.0)
PLATELETS: 236 10*3/uL (ref 150.0–400.0)
RBC: 4.85 Mil/uL (ref 3.87–5.11)
RDW: 12.4 % (ref 11.5–15.5)
WBC: 8.6 10*3/uL (ref 4.0–10.5)

## 2015-09-02 LAB — COMPREHENSIVE METABOLIC PANEL
ALT: 27 U/L (ref 0–35)
AST: 15 U/L (ref 0–37)
Albumin: 4.3 g/dL (ref 3.5–5.2)
Alkaline Phosphatase: 47 U/L (ref 39–117)
BILIRUBIN TOTAL: 0.6 mg/dL (ref 0.2–1.2)
BUN: 12 mg/dL (ref 6–23)
CO2: 29 meq/L (ref 19–32)
Calcium: 9.4 mg/dL (ref 8.4–10.5)
Chloride: 103 mEq/L (ref 96–112)
Creatinine, Ser: 0.9 mg/dL (ref 0.40–1.20)
GFR: 69.3 mL/min (ref >60.00)
GLUCOSE: 100 mg/dL — AB (ref 70–99)
Potassium: 4.7 mEq/L (ref 3.5–5.1)
SODIUM: 138 meq/L (ref 135–145)
Total Protein: 7.4 g/dL (ref 6.0–8.3)

## 2015-09-02 LAB — URINALYSIS, ROUTINE W REFLEX MICROSCOPIC
Bilirubin Urine: NEGATIVE
Hgb urine dipstick: NEGATIVE
Ketones, ur: NEGATIVE
Leukocytes, UA: NEGATIVE
Nitrite: NEGATIVE
RBC / HPF: NONE SEEN (ref 0–?)
Total Protein, Urine: NEGATIVE
URINE GLUCOSE: NEGATIVE
Urobilinogen, UA: 0.2 (ref 0.0–1.0)
WBC UA: NONE SEEN (ref 0–?)
pH: 6.5 (ref 5.0–8.0)

## 2015-09-02 LAB — LIPASE: LIPASE: 18 U/L (ref 11.0–59.0)

## 2015-09-02 LAB — C-REACTIVE PROTEIN: CRP: 2.1 mg/dL (ref 0.5–20.0)

## 2015-09-02 LAB — AMYLASE: Amylase: 41 U/L (ref 27–131)

## 2015-09-02 LAB — TSH: TSH: 2.47 u[IU]/mL (ref 0.35–4.50)

## 2015-09-02 MED ORDER — VALACYCLOVIR HCL 1 G PO TABS: 1000.0000 mg | ORAL_TABLET | Freq: Two times a day (BID) | ORAL | Status: DC

## 2015-09-02 MED ORDER — AMOXICILLIN-POT CLAVULANATE 875-125 MG PO TABS: 1.0000 | ORAL_TABLET | Freq: Two times a day (BID) | ORAL | Status: DC

## 2015-09-02 NOTE — Progress Notes (Signed)
Subjective:  Patient ID: Teresa Hubbard, female    DOB: [birth year]  Age: 55 y.o. MRN: [id number]  CC: Abdominal Pain and Rash  NEW TO ME  HPI KHARTER BORRIES presents for complaints of a painful rash on the right side of her face for 2 days and left lower quadrant abdominal pain for 3 days.  The abdominal pain is described as an intermittent sharp sensation with a slight decrease in her appetite but no nausea/vomiting/fever/chills/hematuria/dysuria/flank pain. She also complains of constipation but denies diarrhea or blood in her stool. She has had similar symptoms to this in the past and it was diagnosed and treated as diverticulitis.  She also has a painful rash on the right side of her face around her right temple for 2 days. Positive history of shingles.  Outpatient Prescriptions Prior to Visit  Medication Sig Dispense Refill  . atorvastatin (LIPITOR) 10 MG tablet Take 1 tablet (10 mg total) by mouth daily. 90 tablet 3  . Cyanocobalamin (VITAMIN B-12) 1000 MCG SUBL Place 1 tablet (1,000 mcg total) under the tongue daily. 100 tablet 3  . fluticasone (FLONASE ALLERGY RELIEF) 50 MCG/ACT nasal spray Place 1 spray into both nostrils 2 (two) times daily. 9.9 g 0  . Lorcaserin HCl ER (BELVIQ XR) 20 MG TB24 Take 1 tablet by mouth every morning. 30 tablet 3   No facility-administered medications prior to visit.    ROS Review of Systems  Constitutional: Positive for appetite change. Negative for fever, chills, diaphoresis, activity change, fatigue and unexpected weight change.  HENT: Negative.   Eyes: Negative.   Respiratory: Negative.  Negative for cough and shortness of breath.   Cardiovascular: Negative.  Negative for chest pain, palpitations and leg swelling.  Gastrointestinal: Positive for abdominal pain and constipation. Negative for nausea, vomiting, diarrhea, blood in stool and anal bleeding.  Endocrine: Negative.   Genitourinary: Negative.  Negative for dysuria, urgency, hematuria,  flank pain, decreased urine volume, vaginal discharge and difficulty urinating.  Musculoskeletal: Negative.  Negative for myalgias, back pain and neck pain.  Skin: Positive for rash.  Allergic/Immunologic: Negative.   Neurological: Negative.  Negative for dizziness.  Hematological: Negative.  Negative for adenopathy. Does not bruise/bleed easily.  Psychiatric/Behavioral: Negative.     Objective:  BP 120/70 mmHg  Pulse 73  Temp(Src) 98.3 F (36.8 C) (Oral)  Resp 16  Ht 5\' 7"  (1.702 m)  Wt 205 lb (92.987 kg)  BMI 32.10 kg/m2  SpO2 98%  LMP 08/19/2015  BP Readings from Last 3 Encounters:  09/02/15 120/70  06/20/15 124/80  03/28/14 130/90    Wt Readings from Last 3 Encounters:  09/02/15 205 lb (92.987 kg)  06/20/15 206 lb (93.441 kg)  03/28/14 199 lb (90.266 kg)    Physical Exam  Constitutional: She is oriented to person, place, and time.  Non-toxic appearance. She does not have a sickly appearance. She does not appear ill. No distress.  HENT:  Mouth/Throat: Oropharynx is clear and moist. No oropharyngeal exudate.  Eyes: Conjunctivae are normal. Right eye exhibits no discharge. Left eye exhibits no discharge. No scleral icterus.  Neck: Normal range of motion. Neck supple. No JVD present. No tracheal deviation present. No thyromegaly present.  Cardiovascular: Normal rate, regular rhythm, normal heart sounds and intact distal pulses.  Exam reveals no gallop.   No murmur heard. Pulmonary/Chest: Effort normal and breath sounds normal. No respiratory distress. She has no wheezes. She has no rales. She exhibits no tenderness.  Abdominal: Soft. Normal appearance  and bowel sounds are normal. She exhibits no distension and no mass. There is tenderness in the left lower quadrant. There is no rigidity, no rebound, no guarding, no CVA tenderness, no tenderness at McBurney's point and negative Murphy's sign.  Musculoskeletal: Normal range of motion. She exhibits no edema or tenderness.    Lymphadenopathy:    She has no cervical adenopathy.  Neurological: She is oriented to person, place, and time.  Skin: Skin is warm and dry. Rash noted. Rash is vesicular. Rash is not macular, not nodular and not pustular. She is not diaphoretic. There is erythema. No pallor.     Vitals reviewed.   Lab Results  Component Value Date   WBC 8.6 09/02/2015   HGB 14.7 09/02/2015   HCT 43.2 09/02/2015   PLT 236.0 09/02/2015   GLUCOSE 100* 09/02/2015   CHOL 217* 06/20/2015   TRIG 148.0 06/20/2015   HDL 55.60 06/20/2015   LDLDIRECT 226.0 03/27/2013   LDLCALC 132* 06/20/2015   ALT 27 09/02/2015   AST 15 09/02/2015   NA 138 09/02/2015   K 4.7 09/02/2015   CL 103 09/02/2015   CREATININE 0.90 09/02/2015   BUN 12 09/02/2015   CO2 29 09/02/2015   TSH 2.47 09/02/2015   PSA 0.00* 01/22/2011    Ct Abdomen Pelvis W Contrast  10/30/2011  *RADIOLOGY REPORT* Clinical Data: Right lower quadrant abdominal pain. CT ABDOMEN AND PELVIS WITH CONTRAST Technique:  Multidetector CT imaging of the abdomen and pelvis was performed following the standard protocol during bolus administration of intravenous contrast. Contrast: 121mL OMNIPAQUE IOHEXOL 300 MG/ML  SOLN Comparison: 10/10/2009 Findings: Atelectasis or infiltration in both lung bases. The liver, spleen, gallbladder, pancreas, adrenal glands, kidneys, abdominal aorta, and retroperitoneal lymph nodes are unremarkable. There is focal wall thickening of the transverse limb of the hepatic flexure of the colon which is associated with a prominent diverticulum and pericolonic inflammatory stranding. This appearance suggests right-sided colonic diverticulitis.  However, given the focal wall thickening, an underlying colon cancer should be excluded.  Follow-up evaluation of the colon after resolution of acute process is recommended.  The stomach and small bowel are decompressed.  There is no evidence of colonic obstruction.  No free air or free fluid in the  abdomen. Pelvis:  Nodular enlargement of the uterus consistent with fibroids, stable since previous study.  No adnexal masses.  Small amount of free fluid in the pelvis which may be physiologic or could arise from the inflammatory process previously discussed. Diverticulosis in the sigmoid colon without diverticulitis.  The appendix is normal.  No significant pelvic lymphadenopathy.  The bladder wall is not thickened.  Normal alignment of the lumbar vertebrae. IMPRESSION: Inflammatory process in the right upper quadrant with associated focal wall thickening of the transverse limb of the hepatic flexure of the colon.  Changes likely to represent right colonic diverticulitis although underlying colon cancer should be excluded. Original Report Authenticated By: Neale Burly, M.D.   Assessment & Plan:   Shunita was seen today for abdominal pain and rash.  Diagnoses and all orders for this visit:  Abdominal tenderness of left lower quadrant- her labs are all normal, there is no blood in the urine, plain films of the abdomen show stool retention, her signs and symptoms are consistent with diverticulitis and I will treat as such -     Lipase; Future -     Comprehensive metabolic panel; Future -     CBC with Differential/Platelet; Future -  Amylase; Future -     TSH; Future -     Urinalysis, Routine w reflex microscopic (not at [facility]); Future -     C-reactive protein; Future -     DG Abd Acute W/Chest; Future  Herpes zoster- will treat with oral valacyclovir -     valACYclovir (VALTREX) 1000 MG tablet; Take 1 tablet (1,000 mg total) by mouth 2 (two) times daily. -     Comprehensive metabolic panel; Future -     CBC with Differential/Platelet; Future  Diverticulitis of large intestine without perforation or abscess without bleeding-  Her white cell count is normal, her CRP is normal, it appears that she has a very mild case of diverticulitis, will treat with Augmentin. -      amoxicillin-clavulanate (AUGMENTIN) 875-125 MG tablet; Take 1 tablet by mouth 2 (two) times daily. -     CBC with Differential/Platelet; Future -     C-reactive protein; Future -     DG Abd Acute W/Chest; Future  I am having Ms. Tutterow start on valACYclovir and amoxicillin-clavulanate. I am also having her maintain her Vitamin B-12, fluticasone, atorvastatin, and Lorcaserin HCl ER.  Meds ordered this encounter  Medications  . valACYclovir (VALTREX) 1000 MG tablet    Sig: Take 1 tablet (1,000 mg total) by mouth 2 (two) times daily.    Dispense:  20 tablet    Refill:  0  . amoxicillin-clavulanate (AUGMENTIN) 875-125 MG tablet    Sig: Take 1 tablet by mouth 2 (two) times daily.    Dispense:  20 tablet    Refill:  1     Follow-up: Return in about 3 weeks (around 09/23/2015).  Scarlette Calico, MD

## 2015-09-02 NOTE — Patient Instructions (Signed)

## 2015-09-02 NOTE — Progress Notes (Signed)
Pre visit review using our clinic review tool, if applicable. No additional management support is needed unless otherwise documented below in the visit note. 

## 2016-06-21 ENCOUNTER — Ambulatory Visit (INDEPENDENT_AMBULATORY_CARE_PROVIDER_SITE_OTHER): Payer: 59 | Admitting: Internal Medicine

## 2016-06-21 VITALS — BP 120/80 | HR 69 | Temp 98.9°F | Resp 16 | Ht 67.0 in | Wt 206.5 lb

## 2016-06-21 DIAGNOSIS — E538 Deficiency of other specified B group vitamins: Secondary | ICD-10-CM | POA: Diagnosis not present

## 2016-06-21 DIAGNOSIS — K635 Polyp of colon: Secondary | ICD-10-CM | POA: Insufficient documentation

## 2016-06-21 DIAGNOSIS — Z0000 Encounter for general adult medical examination without abnormal findings: Secondary | ICD-10-CM

## 2016-06-21 DIAGNOSIS — D122 Benign neoplasm of ascending colon: Secondary | ICD-10-CM | POA: Diagnosis not present

## 2016-06-21 DIAGNOSIS — R21 Rash and other nonspecific skin eruption: Secondary | ICD-10-CM | POA: Insufficient documentation

## 2016-06-21 DIAGNOSIS — E6609 Other obesity due to excess calories: Secondary | ICD-10-CM

## 2016-06-21 DIAGNOSIS — K641 Second degree hemorrhoids: Secondary | ICD-10-CM

## 2016-06-21 DIAGNOSIS — K5732 Diverticulitis of large intestine without perforation or abscess without bleeding: Secondary | ICD-10-CM

## 2016-06-21 DIAGNOSIS — K649 Unspecified hemorrhoids: Secondary | ICD-10-CM | POA: Insufficient documentation

## 2016-06-21 MED ORDER — CLOTRIMAZOLE-BETAMETHASONE 1-0.05 % EX CREA: 1.0000 "application " | TOPICAL_CREAM | Freq: Two times a day (BID) | CUTANEOUS | 1 refills | Status: DC | PRN

## 2016-06-21 MED ORDER — HYDROCORTISONE 2.5 % RE CREA: TOPICAL_CREAM | RECTAL | 1 refills | Status: DC

## 2016-06-21 MED ORDER — ATORVASTATIN CALCIUM 10 MG PO TABS: 10.0000 mg | ORAL_TABLET | Freq: Every day | ORAL | 3 refills | Status: DC

## 2016-06-21 NOTE — Assessment & Plan Note (Signed)
No relapse 

## 2016-06-21 NOTE — Assessment & Plan Note (Signed)
Colon is due 

## 2016-06-21 NOTE — Assessment & Plan Note (Addendum)
Lotrisone SebaMed soap Derm if not better

## 2016-06-21 NOTE — Progress Notes (Signed)
Subjective:  Patient ID: Teresa Hubbard, female    DOB: 1960-12-19  Age: 56 y.o. MRN: [id number]  CC: Annual Exam (fatigue, weight gain, dry skin, anxiety, ) and Hemorrhoids (GI referral for removal and Colonoscopy )   HPI Teresa Hubbard presents for a well exam C/o rash on face C/o hemorrhoids bleeding and polyps  Outpatient Medications Prior to Visit  Medication Sig Dispense Refill  . atorvastatin (LIPITOR) 10 MG tablet Take 1 tablet (10 mg total) by mouth daily. 90 tablet 3  . Cyanocobalamin (VITAMIN B-12) 1000 MCG SUBL Place 1 tablet (1,000 mcg total) under the tongue daily. 100 tablet 3  . fluticasone (FLONASE ALLERGY RELIEF) 50 MCG/ACT nasal spray Place 1 spray into both nostrils 2 (two) times daily. 9.9 g 0  . Lorcaserin HCl ER (BELVIQ XR) 20 MG TB24 Take 1 tablet by mouth every morning. 30 tablet 3  . valACYclovir (VALTREX) 1000 MG tablet Take 1 tablet (1,000 mg total) by mouth 2 (two) times daily. 20 tablet 0  . amoxicillin-clavulanate (AUGMENTIN) 875-125 MG tablet Take 1 tablet by mouth 2 (two) times daily. 20 tablet 1   No facility-administered medications prior to visit.     ROS Review of Systems  Constitutional: Negative for activity change, appetite change, chills, fatigue and unexpected weight change.  HENT: Negative for congestion, mouth sores and sinus pressure.   Eyes: Negative for visual disturbance.  Respiratory: Negative for cough and chest tightness.   Gastrointestinal: Negative for abdominal pain and nausea.  Genitourinary: Negative for difficulty urinating, frequency and vaginal pain.  Musculoskeletal: Negative for back pain and gait problem.  Skin: Positive for rash. Negative for pallor.  Neurological: Negative for dizziness, tremors, weakness, numbness and headaches.  Psychiatric/Behavioral: Negative for confusion and sleep disturbance.    Objective:  BP 120/80   Pulse 69   Temp 98.9 F (37.2 C) (Oral)   Resp 16   Ht 5\' 7"  (1.702 m)   Wt 206 lb 8  oz (93.7 kg)   LMP 05/24/2016   SpO2 97%   BMI 32.34 kg/m   BP Readings from Last 3 Encounters:  06/21/16 120/80  09/02/15 120/70  06/20/15 124/80    Wt Readings from Last 3 Encounters:  06/21/16 206 lb 8 oz (93.7 kg)  09/02/15 205 lb (93 kg)  06/20/15 206 lb (93.4 kg)    Physical Exam  Constitutional: She appears well-developed. No distress.  HENT:  Head: Normocephalic.  Right Ear: External ear normal.  Left Ear: External ear normal.  Nose: Nose normal.  Mouth/Throat: Oropharynx is clear and moist.  Eyes: Conjunctivae are normal. Pupils are equal, round, and reactive to light. Right eye exhibits no discharge. Left eye exhibits no discharge.  Neck: Normal range of motion. Neck supple. No JVD present. No tracheal deviation present. No thyromegaly present.  Cardiovascular: Normal rate, regular rhythm and normal heart sounds.   Pulmonary/Chest: No stridor. No respiratory distress. She has no wheezes.  Abdominal: Soft. Bowel sounds are normal. She exhibits no distension and no mass. There is no tenderness. There is no rebound and no guarding.  Musculoskeletal: She exhibits no edema or tenderness.  Lymphadenopathy:    She has no cervical adenopathy.  Neurological: She displays normal reflexes. No cranial nerve deficit. She exhibits normal muscle tone. Coordination normal.  Skin: No rash noted. No erythema.  Psychiatric: She has a normal mood and affect. Her behavior is normal. Judgment and thought content normal.  Obese Somewhat scaly eryth and hyperpigm rash on  centrlal face, cheeks  Lab Results  Component Value Date   WBC 8.6 09/02/2015   HGB 14.7 09/02/2015   HCT 43.2 09/02/2015   PLT 236.0 09/02/2015   GLUCOSE 100 (H) 09/02/2015   CHOL 217 (H) 06/20/2015   TRIG 148.0 06/20/2015   HDL 55.60 06/20/2015   LDLDIRECT 226.0 03/27/2013   LDLCALC 132 (H) 06/20/2015   ALT 27 09/02/2015   AST 15 09/02/2015   NA 138 09/02/2015   K 4.7 09/02/2015   CL 103 09/02/2015    CREATININE 0.90 09/02/2015   BUN 12 09/02/2015   CO2 29 09/02/2015   TSH 2.47 09/02/2015   PSA 0.00 (L) 01/22/2011    Dg Abd Acute W/chest  Result Date: 09/02/2015 CLINICAL DATA:  Four days of left lower quadrant pain with intermittent constipation, history of diverticulosis and diverticulitis. EXAM: DG ABDOMEN ACUTE W/ 1V CHEST COMPARISON:  Abdominal pelvic CT scan of October 30, 2011 FINDINGS: The lungs are adequately inflated. There is no focal infiltrate. The heart and pulmonary vascularity are normal. The mediastinum is normal in width. There is no pleural effusion. The bony thorax is unremarkable. Within the abdomen the colonic stool burden is moderately increased. There is no small or large bowel obstructive pattern. There is stool and gas in the rectum. No free extraluminal gas collections are observed. No abnormal soft tissue calcifications are observed. The bony structures are unremarkable. IMPRESSION: Increase colonic stool burden consistent with clinical constipation. No evidence of bowel obstruction or ileus. No acute cardiopulmonary abnormality. Electronically Signed   By: David  Martinique M.D.   On: 09/02/2015 15:37    Assessment & Plan:   There are no diagnoses linked to this encounter. I have discontinued Ms. Palecek amoxicillin-clavulanate. I am also having her maintain her Vitamin B-12, fluticasone, atorvastatin, Lorcaserin HCl ER, and valACYclovir.  No orders of the defined types were placed in this encounter.    Follow-up: No Follow-up on file.  Walker Kehr, MD

## 2016-06-21 NOTE — Progress Notes (Signed)
Pre-visit discussion using our clinic review tool. No additional management support is needed unless otherwise documented below in the visit note.  

## 2016-06-21 NOTE — Assessment & Plan Note (Addendum)
Wt Readings from Last 3 Encounters:  06/21/16 206 lb 8 oz (93.7 kg)  09/02/15 205 lb (93 kg)  06/20/15 206 lb (93.4 kg)   Discussed diet - Teresa Hubbard's diet is good

## 2016-06-21 NOTE — Assessment & Plan Note (Signed)
On B12 

## 2016-06-21 NOTE — Patient Instructions (Signed)
[facility]

## 2016-06-21 NOTE — Assessment & Plan Note (Signed)
We discussed age appropriate health related issues, including available/recomended screening tests and vaccinations. We discussed a need for adhering to healthy diet and exercise. Labs/EKG were reviewed/ordered. All questions were answered.  Labs Colon

## 2016-06-21 NOTE — Assessment & Plan Note (Signed)
Gi ref forbanding Proctocort

## 2016-06-22 ENCOUNTER — Other Ambulatory Visit (INDEPENDENT_AMBULATORY_CARE_PROVIDER_SITE_OTHER): Payer: 59

## 2016-06-22 DIAGNOSIS — R21 Rash and other nonspecific skin eruption: Secondary | ICD-10-CM | POA: Diagnosis not present

## 2016-06-22 DIAGNOSIS — Z0000 Encounter for general adult medical examination without abnormal findings: Secondary | ICD-10-CM

## 2016-06-22 DIAGNOSIS — E538 Deficiency of other specified B group vitamins: Secondary | ICD-10-CM

## 2016-06-22 LAB — URINALYSIS
Bilirubin Urine: NEGATIVE
Hgb urine dipstick: NEGATIVE
Ketones, ur: NEGATIVE
LEUKOCYTES UA: NEGATIVE
Nitrite: NEGATIVE
Specific Gravity, Urine: >=1.03 — AB (ref 1.000–1.030)
TOTAL PROTEIN, URINE-UPE24: NEGATIVE
UROBILINOGEN UA: 0.2 (ref 0.0–1.0)
Urine Glucose: NEGATIVE
pH: 6 (ref 5.0–8.0)

## 2016-06-22 LAB — HEPATIC FUNCTION PANEL
ALBUMIN: 4.4 g/dL (ref 3.5–5.2)
ALK PHOS: 42 U/L (ref 39–117)
ALT: 33 U/L (ref 0–35)
AST: 20 U/L (ref 0–37)
Bilirubin, Direct: 0 mg/dL (ref 0.0–0.3)
TOTAL PROTEIN: 7.4 g/dL (ref 6.0–8.3)
Total Bilirubin: 0.3 mg/dL (ref 0.2–1.2)

## 2016-06-22 LAB — LIPID PANEL
CHOL/HDL RATIO: 4
CHOLESTEROL: 224 mg/dL — AB (ref 0–200)
HDL: 52.1 mg/dL (ref >39.00)
LDL Cholesterol: 152 mg/dL — ABNORMAL HIGH (ref 0–99)
NonHDL: 171.51
TRIGLYCERIDES: 100 mg/dL (ref 0.0–149.0)
VLDL: 20 mg/dL (ref 0.0–40.0)

## 2016-06-22 LAB — BASIC METABOLIC PANEL
BUN: 14 mg/dL (ref 6–23)
CALCIUM: 9.5 mg/dL (ref 8.4–10.5)
CO2: 28 meq/L (ref 19–32)
Chloride: 104 mEq/L (ref 96–112)
Creatinine, Ser: 0.92 mg/dL (ref 0.40–1.20)
GFR: 67.37 mL/min (ref >60.00)
GLUCOSE: 100 mg/dL — AB (ref 70–99)
Potassium: 4.2 mEq/L (ref 3.5–5.1)
Sodium: 139 mEq/L (ref 135–145)

## 2016-06-22 LAB — CBC WITH DIFFERENTIAL/PLATELET
BASOS PCT: 3 % (ref 0.0–3.0)
Basophils Absolute: 0.1 10*3/uL (ref 0.0–0.1)
EOS PCT: 4.5 % (ref 0.0–5.0)
Eosinophils Absolute: 0.2 10*3/uL (ref 0.0–0.7)
HCT: 44.3 % (ref 36.0–46.0)
HEMOGLOBIN: 15.1 g/dL — AB (ref 12.0–15.0)
LYMPHS PCT: 39.7 % (ref 12.0–46.0)
Lymphs Abs: 1.6 10*3/uL (ref 0.7–4.0)
MCHC: 34.1 g/dL (ref 30.0–36.0)
MCV: 89.5 fl (ref 78.0–100.0)
Monocytes Absolute: 0.3 10*3/uL (ref 0.1–1.0)
Monocytes Relative: 8.2 % (ref 3.0–12.0)
Neutro Abs: 1.8 10*3/uL (ref 1.4–7.7)
Neutrophils Relative %: 44.6 % (ref 43.0–77.0)
Platelets: 238 10*3/uL (ref 150.0–400.0)
RBC: 4.95 Mil/uL (ref 3.87–5.11)
RDW: 13 % (ref 11.5–15.5)
WBC: 4 10*3/uL (ref 4.0–10.5)

## 2016-06-22 LAB — VITAMIN B12

## 2016-06-22 LAB — T4, FREE: Free T4: 0.76 ng/dL (ref 0.60–1.60)

## 2016-06-22 LAB — TSH: TSH: 4.2 u[IU]/mL (ref 0.35–4.50)

## 2016-07-09 ENCOUNTER — Ambulatory Visit (INDEPENDENT_AMBULATORY_CARE_PROVIDER_SITE_OTHER): Payer: 59 | Admitting: Nurse Practitioner

## 2016-07-09 ENCOUNTER — Encounter: Payer: Self-pay | Admitting: Nurse Practitioner

## 2016-07-09 VITALS — BP 120/80 | HR 76 | Ht 67.0 in | Wt 206.0 lb

## 2016-07-09 DIAGNOSIS — K625 Hemorrhage of anus and rectum: Secondary | ICD-10-CM | POA: Diagnosis not present

## 2016-07-09 DIAGNOSIS — K648 Other hemorrhoids: Secondary | ICD-10-CM | POA: Diagnosis not present

## 2016-07-09 MED ORDER — HYDROCORTISONE 2.5 % RE CREA: TOPICAL_CREAM | RECTAL | 0 refills | Status: DC

## 2016-07-09 MED ORDER — NA SULFATE-K SULFATE-MG SULF 17.5-3.13-1.6 GM/177ML PO SOLN: 1.0000 | Freq: Once | ORAL | 0 refills | Status: AC

## 2016-07-09 NOTE — Progress Notes (Signed)
HPI:  Patient is a 56 year old female known to Dr. Ardis Hughs. She has history of diverticulitis. Patient is referred by PCP Dr. Alain Marion,  for evaluation of hemorrhoids. She has strugged with hemorrhoidal problems since birth of children many years ago. A few weeks ago she began having rectal bleeding, presumably from the hemorrhoids. Patient has a history of chronic constipation, she was constipated prior to the onset of bleeding. She has increased fiber in her diet and constipation has improved. PCP gave her some steroid cream which she has been using externally without much improvement in the hemorrhoids. No abdominal pain. No other GI or general  medical complaints.   Past Medical History:  Diagnosis Date  . B12 DEFICIENCY 08/18/2007  . DIVERTICULOSIS, COLON 02/17/2007    History reviewed. No pertinent surgical history. Family History  Problem Relation Age of Onset  . Ovarian cancer Maternal Aunt   . Lung cancer Maternal Uncle    Social History  Substance Use Topics  . Smoking status: Never Smoker  . Smokeless tobacco: Never Used  . Alcohol use No     Comment: wine occasional   Current Outpatient Prescriptions  Medication Sig Dispense Refill  . atorvastatin (LIPITOR) 10 MG tablet Take 1 tablet (10 mg total) by mouth daily. 90 tablet 3  . Cyanocobalamin (VITAMIN B-12) 1000 MCG SUBL Place 1 tablet (1,000 mcg total) under the tongue daily. 100 tablet 3  . hydrocortisone (ANUSOL-HC) 2.5 % rectal cream Use qhs 30 g 1  . clotrimazole-betamethasone (LOTRISONE) cream Apply 1 application topically 2 (two) times daily as needed. To facial rash 45 g 1   No current facility-administered medications for this visit.    No Known Allergies   Review of Systems: All systems reviewed and negative except where noted in HPI.    Physical Exam: BP 120/80   Pulse 76   Ht 5\' 7"  (1.702 m)   Wt 206 lb (93.4 kg)   SpO2 99%   BMI 32.26 kg/m  Constitutional:  Well-developed, female in no  acute distress. Psychiatric: Normal mood and affect. Behavior is normal. EENT:  Conjunctivae are normal. No scleral icterus. Neck supple.  Cardiovascular: Normal rate, regular rhythm.  Pulmonary/chest: Effort normal and breath sounds normal. No wheezing, rales or rhonchi. Abdominal: Soft, nondistended, nontender. Bowel sounds active throughout. There are no masses palpable. No hepatomegaly. Rectal:  No external lesion, small tag anterior midline. No stool in vault. On anoscopy there were swollen internal hemorrhoids present.  Extremities: no edema Lymphadenopathy: No cervical adenopathy noted. Neurological: Alert and oriented to person place and time. Skin: Skin is warm and dry. No rashes noted.   ASSESSMENT AND PLAN:  56 yo female with recent painless rectal bleeding, presumably from to internal hemorrhoids. She inquires about banding as hemorrhoids have been a problem for years. The rectal bleeding is new -It has been 8 years since last colonoscopy. Given new onset of bleeding it is reasonable to proceed with a repeat colonoscopy to rule out other etiologies of bleeding such as polyps or neoplasm.  Patient will be scheduled for a  colonoscopy with possible polypectomy.  The risks and benefits of the procedure were discussed and the patient agrees to proceed.  -She needs to use the Anusol inside of the rectum since hemorrhoids are internal. I gave her instructions about how to apply the medication internally -Patient has chronic constipation. She has now started to incorporate more fiber into her diet and bowel movements have improved. I stressed the  importance of avoiding hard stools/straining -If bleeding persists and no other etiologies for bleeding found on colonoscopy then she may be candidate for office banding.   Tye Savoy, NP  07/09/2016, 8:43 AM  Cc:  Plotnikov, Evie Lacks, MD

## 2016-07-09 NOTE — Patient Instructions (Signed)
If you are age 55 or older, your body mass index should be between 23-30. Your Body mass index is 32.26 kg/m. If this is out of the aforementioned range listed, please consider follow up with your Primary Care Provider.  If you are age 67 or younger, your body mass index should be between 19-25. Your Body mass index is 32.26 kg/m. If this is out of the aformentioned range listed, please consider follow up with your Primary Care Provider.   You have been scheduled for a colonoscopy. Please follow written instructions given to you at your visit today.  Please pick up your prep supplies at the pharmacy within the next 1-3 days. If you use inhalers (even only as needed), please bring them with you on the day of your procedure. Your physician has requested that you go to www.startemmi.com and enter the access code given to you at your visit today. This web site gives a general overview about your procedure. However, you should still follow specific instructions given to you by our office regarding your preparation for the procedure.  We have sent the following medications to your pharmacy for you to pick up at your convenience: Suprep Anusol Cream   Thank you for choosing me and [facility].  Tye Savoy, NP

## 2016-07-12 NOTE — Progress Notes (Signed)
I agree with the above note, plan 

## 2016-07-13 ENCOUNTER — Encounter: Payer: Self-pay | Admitting: Gastroenterology

## 2016-07-26 ENCOUNTER — Ambulatory Visit (AMBULATORY_SURGERY_CENTER): Payer: 59 | Admitting: Gastroenterology

## 2016-07-26 ENCOUNTER — Encounter: Payer: Self-pay | Admitting: Gastroenterology

## 2016-07-26 VITALS — BP 109/68 | HR 57 | Temp 98.0°F | Resp 9 | Ht 67.0 in | Wt 206.0 lb

## 2016-07-26 DIAGNOSIS — K5730 Diverticulosis of large intestine without perforation or abscess without bleeding: Secondary | ICD-10-CM

## 2016-07-26 DIAGNOSIS — D122 Benign neoplasm of ascending colon: Secondary | ICD-10-CM | POA: Diagnosis not present

## 2016-07-26 DIAGNOSIS — K648 Other hemorrhoids: Secondary | ICD-10-CM

## 2016-07-26 DIAGNOSIS — K625 Hemorrhage of anus and rectum: Secondary | ICD-10-CM

## 2016-07-26 MED ORDER — SODIUM CHLORIDE 0.9 % IV SOLN: 500.0000 mL | INTRAVENOUS | Status: DC

## 2016-07-26 NOTE — Progress Notes (Signed)
Report given to PACU, vss 

## 2016-07-26 NOTE — Progress Notes (Signed)
Pt's states no medical or surgical changes since previsit or office visit. 

## 2016-07-26 NOTE — Patient Instructions (Signed)
Impression/recommendations:  Polyp (handout given) Diverticulosis (handout given) Hemorrhoids (handout given) High Fiber Diet (handout given)  YOU HAD AN ENDOSCOPIC PROCEDURE TODAY AT THE [facility]:   Refer to the procedure report that was given to you for any specific questions about what was found during the examination.  If the procedure report does not answer your questions, please call your gastroenterologist to clarify.  If you requested that your care partner not be given the details of your procedure findings, then the procedure report has been included in a sealed envelope for you to review at your convenience later.  YOU SHOULD EXPECT: Some feelings of bloating in the abdomen. Passage of more gas than usual.  Walking can help get rid of the air that was put into your GI tract during the procedure and reduce the bloating. If you had a lower endoscopy (such as a colonoscopy or flexible sigmoidoscopy) you may notice spotting of blood in your stool or on the toilet paper. If you underwent a bowel prep for your procedure, you may not have a normal bowel movement for a few days.  Please Note:  You might notice some irritation and congestion in your nose or some drainage.  This is from the oxygen used during your procedure.  There is no need for concern and it should clear up in a day or so.  SYMPTOMS TO REPORT IMMEDIATELY:   Following lower endoscopy (colonoscopy or flexible sigmoidoscopy):  Excessive amounts of blood in the stool  Significant tenderness or worsening of abdominal pains  Swelling of the abdomen that is new, acute  Fever of 100F or higher  For urgent or emergent issues, a gastroenterologist can be reached at any hour by calling [phone].   DIET:  We do recommend a small meal at first, but then you may proceed to your regular diet.  Drink plenty of fluids but you should avoid alcoholic beverages for 24 hours.  ACTIVITY:  You should plan to take it  easy for the rest of today and you should NOT DRIVE or use heavy machinery until tomorrow (because of the sedation medicines used during the test).    FOLLOW UP: Our staff will call the number listed on your records the next business day following your procedure to check on you and address any questions or concerns that you may have regarding the information given to you following your procedure. If we do not reach you, we will leave a message.  However, if you are feeling well and you are not experiencing any problems, there is no need to return our call.  We will assume that you have returned to your regular daily activities without incident.  If any biopsies were taken you will be contacted by phone or by letter within the next 1-3 weeks.  Please call us at [phone] if you have not heard about the biopsies in 3 weeks.    SIGNATURES/CONFIDENTIALITY: You and/or your care partner have signed paperwork which will be entered into your electronic medical record.  These signatures attest to the fact that that the information above on your After Visit Summary has been reviewed and is understood.  Full responsibility of the confidentiality of this discharge information lies with you and/or your care-partner. 

## 2016-07-26 NOTE — Progress Notes (Signed)
Called to room to assist during endoscopic procedure.  Patient ID and intended procedure confirmed with present staff. Received instructions for my participation in the procedure from the performing physician.  

## 2016-07-26 NOTE — Op Note (Signed)
[facility] Patient Name: Teresa Hubbard Procedure Date: 07/26/2016 10:49 AM MRN: [id number] Endoscopist: Milus Banister , MD Age: 56 Referring MD:  Date of Birth: Nov 13, 1960 Gender: Female Account #: [id number] Procedure:                Colonoscopy Indications:              Rectal bleeding Medicines:                Monitored Anesthesia Care Procedure:                Pre-Anesthesia Assessment:                           - Prior to the procedure, a History and Physical                            was performed, and patient medications and                            allergies were reviewed. The patient's tolerance of                            previous anesthesia was also reviewed. The risks                            and benefits of the procedure and the sedation                            options and risks were discussed with the patient.                            All questions were answered, and informed consent                            was obtained. Prior Anticoagulants: The patient has                            taken no previous anticoagulant or antiplatelet                            agents. ASA Grade Assessment: II - A patient with                            mild systemic disease. After reviewing the risks                            and benefits, the patient was deemed in                            satisfactory condition to undergo the procedure.                           After obtaining informed consent, the colonoscope  was passed under direct vision. Throughout the                            procedure, the patient's blood pressure, pulse, and                            oxygen saturations were monitored continuously. The                            Colonoscope was introduced through the anus and                            advanced to the the cecum, identified by                            appendiceal orifice and ileocecal valve. The                   colonoscopy was performed without difficulty. The                            patient tolerated the procedure well. The quality                            of the bowel preparation was excellent. The                            ileocecal valve, appendiceal orifice, and rectum                            were photographed. Scope In: 10:52:40 AM Scope Out: 11:03:14 AM Scope Withdrawal Time: 0 hours 8 minutes 45 seconds  Total Procedure Duration: 0 hours 10 minutes 34 seconds  Findings:                 A 3 mm polyp was found in the ascending colon. The                            polyp was sessile. The polyp was removed with a                            cold snare. Resection and retrieval were complete.                           Multiple small and large-mouthed diverticula were                            found in the left colon.                           Internal hemorrhoids were found. The hemorrhoids                            were small.  The exam was otherwise without abnormality on                            direct and retroflexion views. Complications:            No immediate complications. Estimated blood loss:                            None. Estimated Blood Loss:     Estimated blood loss: none. Impression:               - One 3 mm polyp in the ascending colon, removed                            with a cold snare. Resected and retrieved.                           - Diverticulosis in the left colon.                           - Internal hemorrhoids.                           - The examination was otherwise normal on direct                            and retroflexion views. Recommendation:           - Patient has a contact number available for                            emergencies. The signs and symptoms of potential                            delayed complications were discussed with the                            patient. Return to normal  activities tomorrow.                            Written discharge instructions were provided to the                            patient.                           - Resume previous diet.                           - Continue present medications.                           - Continue high fiber diet to control your mild                            constipation; this is usually the best way to  prevent future hemorrhoid troubles.                           You will receive a letter within 2-3 weeks with the                            pathology results and my final recommendations.                           If the polyp(s) is proven to be 'pre-cancerous' on                            pathology, you will need repeat colonoscopy in 5                            years. If the polyp(s) is NOT 'precancerous' on                            pathology then you should repeat colon cancer                            screening in 10 years with colonoscopy without need                            for colon cancer screening by any method prior to                            then (including stool testing). Milus Banister, MD 07/26/2016 11:07:09 AM This report has been signed electronically.

## 2016-07-27 ENCOUNTER — Telehealth: Payer: Self-pay

## 2016-07-27 ENCOUNTER — Telehealth: Payer: Self-pay | Admitting: *Deleted

## 2016-07-27 NOTE — Telephone Encounter (Signed)
Message left

## 2016-07-27 NOTE — Telephone Encounter (Signed)
Left message on answering machine. 

## 2016-08-02 ENCOUNTER — Encounter: Payer: Self-pay | Admitting: Gastroenterology

## 2016-08-06 DIAGNOSIS — Z01419 Encounter for gynecological examination (general) (routine) without abnormal findings: Secondary | ICD-10-CM | POA: Diagnosis not present

## 2016-08-06 DIAGNOSIS — Z1231 Encounter for screening mammogram for malignant neoplasm of breast: Secondary | ICD-10-CM | POA: Diagnosis not present

## 2016-08-06 LAB — HM MAMMOGRAPHY

## 2016-08-26 ENCOUNTER — Ambulatory Visit (INDEPENDENT_AMBULATORY_CARE_PROVIDER_SITE_OTHER): Payer: 59 | Admitting: Sports Medicine

## 2016-08-26 ENCOUNTER — Ambulatory Visit: Payer: Self-pay

## 2016-08-26 ENCOUNTER — Encounter: Payer: Self-pay | Admitting: Sports Medicine

## 2016-08-26 VITALS — BP 138/84 | HR 68 | Ht 67.0 in | Wt 204.6 lb

## 2016-08-26 DIAGNOSIS — M722 Plantar fascial fibromatosis: Secondary | ICD-10-CM | POA: Diagnosis not present

## 2016-08-26 DIAGNOSIS — M79672 Pain in left foot: Secondary | ICD-10-CM | POA: Diagnosis not present

## 2016-08-26 DIAGNOSIS — M79671 Pain in right foot: Secondary | ICD-10-CM

## 2016-08-26 NOTE — Patient Instructions (Addendum)
Please perform the exercise program that Teresa Hubbard has prepared for you and gone over in detail on a daily basis.  In addition to the handout you were provided you can access your program through: [email or web address]   Your unique program code is: [email or web address]   You can use the compression sleeve at any time throughout the day but is most important to use while being active as well as for 2 hours post-activity.   It is appropriate to ice following activity with the compression sleeve in place.

## 2016-08-26 NOTE — Assessment & Plan Note (Addendum)
Markedly abnormal plantar fascia on ultrasound. Discussed the merits of injection versus  appropriate conservative care and she wishes for injections today given the upcoming trip to [place]. Emphasized the importance of continued conservative care including with Alfredson exercises,  and arch strapping.  Body Helix compression sleeve was provided today.  Can consider custom cushion orthotics versus over-the-counter insoles if any lack of improvement.  +++++++++++++++++++++++++++++++++++++++++++++++++++++++++++++++ PROCEDURE NOTE: THERAPEUTIC EXERCISES (97110) 15 minutes spent for Therapeutic exercises as stated in above notes.  This included exercises focusing on stretching, strengthening, with significant focus on eccentric aspects.   Proper technique shown and discussed handout in great detail with ATC.  All questions were discussed and answered.    ++++++++++++++++++++++++++++++++++++++++++++  PROCEDURE NOTE -  ULTRASOUND GUIDEDINJECTION: bilateral plantar fascia Images were obtained and interpreted by myself, Teresa Coombs, DO  Images have been saved and stored to PACS system. Images obtained on: GE S7 Ultrasound machine  ULTRASOUND FINDINGS:  Right PF = 0.61 - normal long arch Left PF = 0.90 - long arch 0.23  DESCRIPTION OF PROCEDURE:  The patient's clinical condition is marked by substantial pain and/or significant functional disability. Other conservative therapy has not provided relief, is contraindicated, or not appropriate. There is a reasonable likelihood that injection will significantly improve the patient's pain and/or functional impairment. After discussing the risks, benefits and expected outcomes of the injection and all questions were reviewed and answered, the patient wished to undergo the above named procedure. Verbal consent was obtained. The ultrasound was used to identify the target structure and adjacent neurovascular structures. The skin was then prepped in sterile  fashion and the target structure was injected under direct visualization using sterile technique as below: LEFT PF:  PREP: Alcohol, Ethel Chloride APPROACH: medial, single injection with barbatoge, 22g 1.5" needle INJECTATE: 1cc 0.5% marcaine, 1 cc 40mg  DepoMedrol ASPIRATE: N/A DRESSING: Band-Aid   LEFT PF:  PREP: Alcohol, Ethel Chloride APPROACH: medial, single injection with barbatoge, 25g 1.5" needle INJECTATE: 1cc 0.5% marcaine, 1 cc 40mg  DepoMedrol ASPIRATE: N/A DRESSING: Band-Aid  Post procedural instructions including recommending icing and warning signs for infection were reviewed. This procedure was well tolerated and there were no complications.   IMPRESSION: Succesful US Guided Injections with left barbatoge

## 2016-08-26 NOTE — Progress Notes (Signed)
OFFICE VISIT NOTE Teresa Hubbard. [place], [place] [facility] [place] - 56 y.o. female MRN [id number]  Date of birth: 08/04/1960  Visit Date: 08/26/2016  PCP: Cassandria Anger, MD   Referred by: Cassandria Anger, MD  Burlene Arnt, CMA acting as scribe for Dr. Paulla Fore.  SUBJECTIVE:   Chief Complaint  Patient presents with  . pain on bottom of feet   HPI: As below and per problem based documentation when appropriate.  Pt presents today with complaint of plantar fasciitis of both feet.Pain on the right foot is lateral and pain in the left foot is lateral, medial, and posterior.  Pain started about 1 year ago. Pain is off an on. Pain has been worse over the past 2 months. Pt is planning a trip to [place] and is hoping to have sx resolved by then.   The pain is described as sharp and is rated as 8/10.  Worsened with first getting up in the morning or after sitting for prolonged periods of time.  Improves with home exercises Therapies tried include : Pt has not tried any medications for the pain. Pt was seen at Dr. Jenne Campus a year ago and given a steroid injection and a boot. She got some relief with this.   Other associated symptoms include: none  Pt denies fever, chills, night sweats, no unintentional weight loss or weight gain.     Review of Systems  Constitutional: Negative for chills and fever.  Respiratory: Negative for shortness of breath and wheezing.   Cardiovascular: Negative for chest pain, palpitations and leg swelling.  Musculoskeletal: Negative for falls.  Neurological: Negative for dizziness, tingling and headaches.  Endo/Heme/Allergies: Does not bruise/bleed easily.    Otherwise per HPI.  HISTORY & PERTINENT PRIOR DATA:  No specialty comments available. She reports that she has never smoked. She has never used smokeless tobacco. No results for input(s): HGBA1C, LABURIC in the last 8760  hours. Medications & Allergies reviewed per EMR Patient Active Problem List   Diagnosis Date Noted  . Plantar fasciitis, bilateral 08/26/2016  . Hemorrhoids 06/21/2016  . Colon polyps 06/21/2016  . Rash 06/21/2016  . Abdominal tenderness of left lower quadrant 09/02/2015  . Herpes zoster 09/02/2015  . Diverticulitis of large intestine without perforation or abscess without bleeding 09/02/2015  . Obesity 06/21/2015  . Hot flashes 06/07/2012  . Abdominal pain, unspecified site 06/06/2012  . Strep throat 03/01/2012  . Diverticulitis 11/01/2011  . Abdominal pain, other specified site 10/29/2011  . Well adult exam 01/26/2011  . Cystitis 01/26/2011  . Hyperlipidemia 01/26/2011  . Left conjunctivitis 12/14/2010  . LOW BACK PAIN, ACUTE 05/26/2010  . HEMATOCHEZIA 08/13/2008  . Neoplasm of uncertain behavior of skin 08/18/2007  . B12 deficiency 08/18/2007  . Vitamin D deficiency 08/18/2007  . Diverticulosis of colon 02/17/2007  . FATIGUE 02/17/2007   Past Medical History:  Diagnosis Date  . B12 DEFICIENCY 08/18/2007  . DIVERTICULOSIS, COLON 02/17/2007   Family History  Problem Relation Age of Onset  . Ovarian cancer Maternal Aunt   . Lung cancer Maternal Uncle    No past surgical history on file. Social History   Occupational History  . quality enginer [employer]   Social History Main Topics  . Smoking status: Never Smoker  . Smokeless tobacco: Never Used  . Alcohol use Yes     Comment: wine occasional  . Drug use: No  . Sexual activity: Yes  OBJECTIVE:  VS:  HT:5\' 7"  (170.2 cm)   WT:204 lb 9.6 oz (92.8 kg)  BMI:32.1    BP:138/84  HR:68bpm  TEMP: ( )  RESP:96 % EXAM: Findings:  WDWN, NAD, Non-toxic appearing Alert & appropriately interactive Not depressed or anxious appearing No increased work of breathing. Pupils are equal. EOM intact without nystagmus No clubbing or cyanosis of the extremities appreciated No significant rashes/lesions/ulcerations overlying the  examined area. DP & PT pulses 2+/4.  No significant pretibial edema.  No clubbing or cyanosis Sensation intact to light touch in lower extremities.  Foot & Ankle: Overall foot and ankle are well aligned, no significant deformity No significant TTP over the base of the 5th metatarsal, navicular, or posterior aspect of medial or lateral malleolus Insertion of the plantar fascia over bilateral heels. No significant pain or discomfort with isolated passive forefoot abduction. Inversion, eversion, dorsiflexion and plantar flexion strength 5+/5. Stable to ankle drawer and talar tilt.  Negative cotton test Ankle dorsiflexion to 110 bilaterally.  Moderately high cavus arch. Moderately high cavus arch        No results found. ASSESSMENT & PLAN:   Problem List Items Addressed This Visit    Plantar fasciitis, bilateral    Markedly abnormal plantar fascia on ultrasound. Discussed the merits of injection versus  appropriate conservative care and she wishes for injections today given the upcoming trip to [place]. Emphasized the importance of continued conservative care including with Alfredson exercises,  and arch strapping.  Body Helix compression sleeve was provided today.  Can consider custom cushion orthotics versus over-the-counter insoles if any lack of improvement.  +++++++++++++++++++++++++++++++++++++++++++++++++++++++++++++++ PROCEDURE NOTE: THERAPEUTIC EXERCISES (97110) 15 minutes spent for Therapeutic exercises as stated in above notes.  This included exercises focusing on stretching, strengthening, with significant focus on eccentric aspects.   Proper technique shown and discussed handout in great detail with ATC.  All questions were discussed and answered.    ++++++++++++++++++++++++++++++++++++++++++++  PROCEDURE NOTE -  ULTRASOUND GUIDEDINJECTION: bilateral plantar fascia Images were obtained and interpreted by myself, Teresa Coombs, DO  Images have been saved and stored to PACS  system. Images obtained on: GE S7 Ultrasound machine  ULTRASOUND FINDINGS:  Right PF = 0.61 - normal long arch Left PF = 0.90 - long arch 0.23  DESCRIPTION OF PROCEDURE:  The patient's clinical condition is marked by substantial pain and/or significant functional disability. Other conservative therapy has not provided relief, is contraindicated, or not appropriate. There is a reasonable likelihood that injection will significantly improve the patient's pain and/or functional impairment. After discussing the risks, benefits and expected outcomes of the injection and all questions were reviewed and answered, the patient wished to undergo the above named procedure. Verbal consent was obtained. The ultrasound was used to identify the target structure and adjacent neurovascular structures. The skin was then prepped in sterile fashion and the target structure was injected under direct visualization using sterile technique as below: LEFT PF:  PREP: Alcohol, Ethel Chloride APPROACH: medial, single injection with barbatoge, 22g 1.5" needle INJECTATE: 1cc 0.5% marcaine, 1 cc 40mg  DepoMedrol ASPIRATE: N/A DRESSING: Band-Aid   LEFT PF:  PREP: Alcohol, Ethel Chloride APPROACH: medial, single injection with barbatoge, 25g 1.5" needle INJECTATE: 1cc 0.5% marcaine, 1 cc 40mg  DepoMedrol ASPIRATE: N/A DRESSING: Band-Aid  Post procedural instructions including recommending icing and warning signs for infection were reviewed. This procedure was well tolerated and there were no complications.   IMPRESSION: Succesful US Guided Injections with left barbatoge  Other Visit Diagnoses    Bilateral foot pain    -  Primary   Relevant Orders   [place] LIMITED JOINT SPACE STRUCTURES LOW BILAT(NO LINKED CHARGES)      Follow-up: Return in about 6 weeks (around 10/07/2016).   CMA/ATC served as Education administrator during this visit. History, Physical, and Plan performed by medical provider. Documentation and orders  reviewed and attested to.      Teresa Coombs, [facility] Physician `

## 2016-08-31 ENCOUNTER — Telehealth: Payer: Self-pay | Admitting: Internal Medicine

## 2016-08-31 MED ORDER — METRONIDAZOLE 500 MG PO TABS: 500.0000 mg | ORAL_TABLET | Freq: Two times a day (BID) | ORAL | 0 refills | Status: DC

## 2016-08-31 MED ORDER — CIPROFLOXACIN HCL 500 MG PO TABS: 500.0000 mg | ORAL_TABLET | Freq: Two times a day (BID) | ORAL | 1 refills | Status: DC

## 2016-08-31 NOTE — Telephone Encounter (Signed)
Needs abx for travel Rx emailed

## 2016-10-07 ENCOUNTER — Ambulatory Visit: Payer: 59 | Admitting: Sports Medicine

## 2017-03-11 ENCOUNTER — Encounter: Payer: Self-pay | Admitting: Family

## 2017-03-11 ENCOUNTER — Ambulatory Visit: Payer: Self-pay | Admitting: *Deleted

## 2017-03-11 ENCOUNTER — Ambulatory Visit: Payer: 59 | Admitting: Family

## 2017-03-11 DIAGNOSIS — M545 Low back pain, unspecified: Secondary | ICD-10-CM

## 2017-03-11 DIAGNOSIS — R194 Change in bowel habit: Secondary | ICD-10-CM | POA: Diagnosis not present

## 2017-03-11 MED ORDER — MELOXICAM 15 MG PO TABS: 15.0000 mg | ORAL_TABLET | Freq: Every day | ORAL | 0 refills | Status: DC

## 2017-03-11 MED ORDER — METHOCARBAMOL 500 MG PO TABS: 500.0000 mg | ORAL_TABLET | Freq: Three times a day (TID) | ORAL | 0 refills | Status: DC | PRN

## 2017-03-11 NOTE — Progress Notes (Signed)
Teresa Hubbard is a 56 y.o. female with the following history as recorded in EpicCare:  Patient Active Problem List   Diagnosis Date Noted  . Plantar fasciitis, bilateral 08/26/2016  . Hemorrhoids 06/21/2016  . Colon polyps 06/21/2016  . Rash 06/21/2016  . Abdominal tenderness of left lower quadrant 09/02/2015  . Herpes zoster 09/02/2015  . Diverticulitis of large intestine without perforation or abscess without bleeding 09/02/2015  . Obesity 06/21/2015  . Hot flashes 06/07/2012  . Abdominal pain, unspecified site 06/06/2012  . Strep throat 03/01/2012  . Diverticulitis 11/01/2011  . Abdominal pain, other specified site 10/29/2011  . Well adult exam 01/26/2011  . Cystitis 01/26/2011  . Hyperlipidemia 01/26/2011  . Left conjunctivitis 12/14/2010  . LOW BACK PAIN, ACUTE 05/26/2010  . HEMATOCHEZIA 08/13/2008  . Neoplasm of uncertain behavior of skin 08/18/2007  . B12 deficiency 08/18/2007  . Vitamin D deficiency 08/18/2007  . Diverticulosis of colon 02/17/2007  . FATIGUE 02/17/2007    Current Outpatient Medications  Medication Sig Dispense Refill  . atorvastatin (LIPITOR) 10 MG tablet Take 1 tablet (10 mg total) by mouth daily. 90 tablet 3  . Cyanocobalamin (VITAMIN B-12) 1000 MCG SUBL Place 1 tablet (1,000 mcg total) under the tongue daily. 100 tablet 3  . hydrocortisone (ANUSOL-HC) 2.5 % rectal cream Use at bedtime X 7 days 30 g 0  . meloxicam (MOBIC) 15 MG tablet Take 1 tablet (15 mg total) by mouth daily. 30 tablet 0  . methocarbamol (ROBAXIN) 500 MG tablet Take 1 tablet (500 mg total) by mouth every 8 (eight) hours as needed for muscle spasms. 21 tablet 0   Current Facility-Administered Medications  Medication Dose Route Frequency Provider Last Rate Last Dose  . 0.9 %  sodium chloride infusion  500 mL Intravenous Continuous Milus Banister, MD        Allergies: Patient has no known allergies.  Past Medical History:  Diagnosis Date  . B12 DEFICIENCY 08/18/2007  .  DIVERTICULOSIS, COLON 02/17/2007    History reviewed. No pertinent surgical history.  Family History  Problem Relation Age of Onset  . Ovarian cancer Maternal Aunt   . Lung cancer Maternal Uncle     Social History   Tobacco Use  . Smoking status: Never Smoker  . Smokeless tobacco: Never Used  Substance Use Topics  . Alcohol use: Yes    Comment: wine occasional    Subjective:  Patient presents with 2 concerns:  1) low back pain x 4 days; seemed to start after shoveling snow earlier this week; has been getting some relief with OTC Aleve; no numbness tingling radiating into extremities;  2) Concerned for changes in her bowel habits in the past 3 months- increased diarrhea/ changes in consistency of stool; denies abdominal pain but does feel bloated; denies any blood in the stool; colonoscopy up to date- done earlier this year.   Objective:  Vitals:   03/11/17 1338  BP: 126/82  Pulse: 60  Temp: 98.3 F (36.8 C)  TempSrc: Oral  SpO2: 98%  Weight: 208 lb (94.3 kg)  Height: 5\' 7"  (1.702 m)    General: Well developed, well nourished, in no acute distress  Skin : Warm and dry.  Head: Normocephalic and atraumatic  Lungs: Respirations unlabored; clear to auscultation bilaterally without wheeze, rales, rhonchi  Musculoskeletal: No deformities; no active joint inflammation  Extremities: No edema, cyanosis, clubbing  Vessels: Symmetric bilaterally  Neurologic: Alert and oriented; speech intact; face symmetrical; moves all extremities well; CNII-XII intact  without focal deficit  Assessment:  1. Acute low back pain without sciatica, unspecified back pain laterality   2. Change in bowel habits     Plan:  1. Suspect muscular; reassurance given; Rx for Mobic and Robaxin; can apply heat to help; follow-up worse, no better. 2. Refer to GI for further evaluation.    No Follow-up on file.  Orders Placed This Encounter  Procedures  . Ambulatory referral to Gastroenterology    Referral  Priority:   Routine    Referral Type:   Consultation    Referral Reason:   Specialty Services Required    Number of Visits Requested:   1    Requested Prescriptions   Signed Prescriptions Disp Refills  . meloxicam (MOBIC) 15 MG tablet 30 tablet 0    Sig: Take 1 tablet (15 mg total) by mouth daily.  . methocarbamol (ROBAXIN) 500 MG tablet 21 tablet 0    Sig: Take 1 tablet (500 mg total) by mouth every 8 (eight) hours as needed for muscle spasms.

## 2017-03-11 NOTE — Telephone Encounter (Signed)
   Reason for Disposition . [1] MODERATE back pain (e.g., interferes with normal activities) AND [2] present > 3 days  Answer Assessment - Initial Assessment Questions 1. ONSET: "When did the pain begin?"      4 days 2. LOCATION: "Where does it hurt?" (upper, mid or lower back)     All across lower back, mostly center 3. SEVERITY: "How bad is the pain?"  (e.g., Scale 1-10; mild, moderate, or severe)   - MILD (1-3): doesn't interfere with normal activities    - MODERATE (4-7): interferes with normal activities or awakens from sleep    - SEVERE (8-10): excruciating pain, unable to do any normal activities     6 4. PATTERN: "Is the pain constant?" (e.g., yes, no; constant, intermittent)      Constant but with movement it gets worse, especially up and down movement. 5. RADIATION: "Does the pain shoot into your legs or elsewhere?"     no 6. CAUSE:  "What do you think is causing the back pain?"      Shoveled snow on Monday and it has been hurting ever since. 7. BACK OVERUSE:  "Any recent lifting of heavy objects, strenuous work or exercise?"     Shoveling snow 8. MEDICATIONS: "What have you taken so far for the pain?" (e.g., nothing, acetaminophen, NSAIDS)     Took tylenol at times and it helped a little 9. NEUROLOGIC SYMPTOMS: "Do you have any weakness, numbness, or problems with bowel/bladder control?"     none 10. OTHER SYMPTOMS: "Do you have any other symptoms?" (e.g., fever, abdominal pain, burning with urination, blood in urine)       no 11. PREGNANCY: "Is there any chance you are pregnant?" (e.g., yes, no; LMP)       no  Protocols used: BACK PAIN-A-AH

## 2017-03-14 ENCOUNTER — Encounter: Payer: Self-pay | Admitting: Gastroenterology

## 2017-03-14 ENCOUNTER — Other Ambulatory Visit: Payer: 59

## 2017-03-14 ENCOUNTER — Ambulatory Visit: Payer: 59 | Admitting: Gastroenterology

## 2017-03-14 VITALS — BP 118/70 | HR 70 | Ht 67.0 in | Wt 207.0 lb

## 2017-03-14 DIAGNOSIS — R197 Diarrhea, unspecified: Secondary | ICD-10-CM

## 2017-03-14 DIAGNOSIS — R194 Change in bowel habit: Secondary | ICD-10-CM

## 2017-03-14 NOTE — Progress Notes (Signed)
Review of pertinent gastrointestinal problems: 1. History of adenomatous colon polyp:  Colonoscopy 2011 found 3 small hyperplastic polyps as well as pan diverticulosis.  She was recommended to have repeat colonoscopy at 10 year interval.  Colonoscopy 06/2016 for minor rectal bleeding (likely hemorrhoidal) found internal hemorrhoids, left sided diverticulosis, 63mm polyp (TA by pathology): recommended to have surveillance colonoscopy at 5 year interval. 2. Diverticulosis, confirmed by colonoscopy 2011. 3. diverticulitis, hepatic flexure August 2013 confirmed by CT scan; this was mild, treated quite easily with oral antibiotics.  Clinically she had had diverticulitis in the sigmoid colon once or twice previously. No CT scan proof of this however.   HPI: This is a very pleasant 56 year old woman whom I last saw 8 months ago at the time of a colonoscopy.  See those results summarized above.  3 months ago a sharp pain in left l quad (left flank almost); went away after 2 days.  The pain is gone but bowel changes looser than usual, have persisted since then.  She did not have any fevers or chills with the pain.  No bleeding.  [place] and [place] over the summer.  While she was there she had some significant left-sided abdominal pains and presumed she had diverticulitis.  She took some antibiotics that her primary care physician sent her with  Chief complaint is change in bowels  ROS: complete GI ROS as described in HPI, all other review negative.  Constitutional:  No unintentional weight loss   Past Medical History:  Diagnosis Date  . B12 DEFICIENCY 08/18/2007  . DIVERTICULOSIS, COLON 02/17/2007    History reviewed. No pertinent surgical history.  Current Outpatient Medications  Medication Sig Dispense Refill  . atorvastatin (LIPITOR) 10 MG tablet Take 1 tablet (10 mg total) by mouth daily. 90 tablet 3  . Cyanocobalamin (VITAMIN B-12) 1000 MCG SUBL Place 1 tablet (1,000 mcg total) under the  tongue daily. 100 tablet 3  . hydrocortisone (ANUSOL-HC) 2.5 % rectal cream Use at bedtime X 7 days 30 g 0  . meloxicam (MOBIC) 15 MG tablet Take 1 tablet (15 mg total) by mouth daily. 30 tablet 0  . methocarbamol (ROBAXIN) 500 MG tablet Take 1 tablet (500 mg total) by mouth every 8 (eight) hours as needed for muscle spasms. 21 tablet 0   Current Facility-Administered Medications  Medication Dose Route Frequency Provider Last Rate Last Dose  . 0.9 %  sodium chloride infusion  500 mL Intravenous Continuous Milus Banister, MD        Allergies as of 03/14/2017  . (No Known Allergies)    Family History  Problem Relation Age of Onset  . Ovarian cancer Maternal Aunt   . Lung cancer Maternal Uncle     Social History   Socioeconomic History  . Marital status: Married    Spouse name: Not on file  . Number of children: 2  . Years of education: Not on file  . Highest education level: Not on file  Social Needs  . Financial resource strain: Not on file  . Food insecurity - worry: Not on file  . Food insecurity - inability: Not on file  . Transportation needs - medical: Not on file  . Transportation needs - non-medical: Not on file  Occupational History  . Occupation: Armed forces operational officer: [employer]  Tobacco Use  . Smoking status: Never Smoker  . Smokeless tobacco: Never Used  Substance and Sexual Activity  . Alcohol use: Yes    Comment: wine  occasional  . Drug use: No  . Sexual activity: Yes  Other Topics Concern  . Not on file  Social History Narrative  . Not on file     Physical Exam: Ht 5\' 7"  (1.702 m)   Wt 207 lb (93.9 kg)   BMI 32.42 kg/m  Constitutional: generally well-appearing Psychiatric: alert and oriented x3 Abdomen: soft, nontender, nondistended, no obvious ascites, no peritoneal signs, normal bowel sounds No peripheral edema noted in lower extremities  Assessment and plan: 56 y.o. female with change in bowel habits She has had mild change in her  bowel habits and some left lower quadrant pain 2 or 3 months ago.  She has had diverticulitis confirmed by CT scan in the past and diverticulosis noted by colonoscopy.  Possibly she had a mild diverticulitis attack 2 months ago which resolved without antibiotics.  She may have some mild, chronic diverticular changes and thickening and edema in her mucosa now.  Going to try her nonspecifically on a single Imodium once daily to see if that will help.  She will also get a battery of stool testing to check for chronic infection.  She had a colonoscopy 8 months ago and that does not need to be repeated now.  Please see the "Patient Instructions" section for addition details about the plan.  Owens Loffler, MD [facility] 03/14/2017, 3:22 PM

## 2017-03-14 NOTE — Patient Instructions (Addendum)
You will have labs checked today in the basement lab.  Please head down after you check out with the front desk  (stool for c. Difficile, routine culture, ova and parasites).  Try a single OTC imodium once every morning shortly waking. If this causes constipation then try a 1/2 pill instead.  Normal BMI (Body Mass Index- based on height and weight) is between 19 and 25. Your BMI today is Body mass index is 32.42 kg/m. Marland Kitchen Please consider follow up  regarding your BMI with your Primary Care Provider.

## 2017-03-15 ENCOUNTER — Other Ambulatory Visit: Payer: 59

## 2017-03-15 DIAGNOSIS — R197 Diarrhea, unspecified: Secondary | ICD-10-CM

## 2017-03-16 LAB — OVA AND PARASITE EXAMINATION
CONCENTRATE RESULT: NONE SEEN
MICRO NUMBER:: 81421617
SPECIMEN QUALITY: ADEQUATE
TRICHROME RESULT: NONE SEEN

## 2017-03-16 LAB — C. DIFFICILE GDH AND TOXIN A/B
GDH ANTIGEN: NOT DETECTED
MICRO NUMBER: 81421496
SPECIMEN QUALITY:: ADEQUATE
TOXIN A AND B: NOT DETECTED

## 2017-03-19 LAB — STOOL CULTURE
MICRO NUMBER: 81421614
MICRO NUMBER:: 81421615
MICRO NUMBER:: 81421616
SHIGA RESULT:: NOT DETECTED
SPECIMEN QUALITY: ADEQUATE
SPECIMEN QUALITY: ADEQUATE
SPECIMEN QUALITY: ADEQUATE

## 2017-03-28 ENCOUNTER — Other Ambulatory Visit: Payer: Self-pay | Admitting: Internal Medicine

## 2017-04-28 ENCOUNTER — Ambulatory Visit (INDEPENDENT_AMBULATORY_CARE_PROVIDER_SITE_OTHER): Payer: 59 | Admitting: Internal Medicine

## 2017-04-28 ENCOUNTER — Encounter: Payer: Self-pay | Admitting: Internal Medicine

## 2017-04-28 ENCOUNTER — Other Ambulatory Visit (INDEPENDENT_AMBULATORY_CARE_PROVIDER_SITE_OTHER): Payer: 59

## 2017-04-28 VITALS — BP 110/70 | HR 58 | Temp 97.7°F | Ht 67.0 in | Wt 204.0 lb

## 2017-04-28 DIAGNOSIS — R1032 Left lower quadrant pain: Secondary | ICD-10-CM

## 2017-04-28 DIAGNOSIS — R10814 Left lower quadrant abdominal tenderness: Secondary | ICD-10-CM | POA: Diagnosis not present

## 2017-04-28 LAB — COMPREHENSIVE METABOLIC PANEL
ALT: 30 U/L (ref 0–35)
AST: 18 U/L (ref 0–37)
Albumin: 4.4 g/dL (ref 3.5–5.2)
Alkaline Phosphatase: 43 U/L (ref 39–117)
BUN: 15 mg/dL (ref 6–23)
CO2: 29 meq/L (ref 19–32)
Calcium: 9.6 mg/dL (ref 8.4–10.5)
Chloride: 103 mEq/L (ref 96–112)
Creatinine, Ser: 0.83 mg/dL (ref 0.40–1.20)
GFR: 75.63 mL/min (ref >60.00)
GLUCOSE: 92 mg/dL (ref 70–99)
POTASSIUM: 4.5 meq/L (ref 3.5–5.1)
Sodium: 137 mEq/L (ref 135–145)
Total Bilirubin: 0.6 mg/dL (ref 0.2–1.2)
Total Protein: 7.6 g/dL (ref 6.0–8.3)

## 2017-04-28 LAB — CBC
HCT: 42.2 % (ref 36.0–46.0)
HEMOGLOBIN: 14.5 g/dL (ref 12.0–15.0)
MCHC: 34.3 g/dL (ref 30.0–36.0)
MCV: 89.4 fl (ref 78.0–100.0)
PLATELETS: 276 10*3/uL (ref 150.0–400.0)
RBC: 4.72 Mil/uL (ref 3.87–5.11)
RDW: 13 % (ref 11.5–15.5)
WBC: 5.3 10*3/uL (ref 4.0–10.5)

## 2017-04-28 LAB — LIPASE: Lipase: 28 U/L (ref 11.0–59.0)

## 2017-04-28 MED ORDER — AMOXICILLIN-POT CLAVULANATE 875-125 MG PO TABS: 1.0000 | ORAL_TABLET | Freq: Two times a day (BID) | ORAL | 0 refills | Status: DC

## 2017-04-28 NOTE — Progress Notes (Signed)
   Subjective:    Patient ID: [name], female    DOB: March 27, 1961, 57 y.o.   MRN: [id number]  HPI The patient is a 57 YO female coming in for left side pain. She was not able to sleep due to the severe nature of the pain last night. This morning it is some improved. She denies any factors that worsen or improve the pain. It is improving. She denies fevers or chills. Some constipation lately. She does have diverticulosis and has had diverticulitis in the past documented on CT scan. This feels similar. She denies blood in stool. Has not taken any otcs for the pain. Last BM day prior to the visit.   Review of Systems  Constitutional: Negative.   HENT: Negative.   Eyes: Negative.   Respiratory: Negative for cough, chest tightness and shortness of breath.   Cardiovascular: Negative for chest pain, palpitations and leg swelling.  Gastrointestinal: Positive for abdominal pain and constipation. Negative for abdominal distention, diarrhea, nausea and vomiting.  Musculoskeletal: Negative.   Skin: Negative.   Neurological: Negative.   Psychiatric/Behavioral: Negative.       Objective:   Physical Exam  Constitutional: She is oriented to person, place, and time. She appears well-developed and well-nourished.  HENT:  Head: Normocephalic and atraumatic.  Eyes: EOM are normal.  Neck: Normal range of motion.  Cardiovascular: Normal rate and regular rhythm.  Pulmonary/Chest: Effort normal and breath sounds normal. No respiratory distress. She has no wheezes. She has no rales.  Abdominal: Soft. Bowel sounds are normal. She exhibits no distension and no mass. There is tenderness. There is no rebound and no guarding.  Pain LLQ without rebound or guarding. BS normal.   Musculoskeletal: She exhibits no edema.  Neurological: She is alert and oriented to person, place, and time. Coordination normal.  Skin: Skin is warm and dry.  Psychiatric: She has a normal mood and affect.   Vitals:   04/28/17 1119    BP: 110/70  Pulse: (!) 58  Temp: 97.7 F (36.5 C)  TempSrc: Oral  SpO2: 98%  Weight: 204 lb (92.5 kg)  Height: 5\' 7"  (1.702 m)      Assessment & Plan:

## 2017-04-28 NOTE — Patient Instructions (Signed)
We have sent in the antibiotic augmentin. Take 1 pill twice a day for 1 week.   We are checking the labs today and will call you back with the results.

## 2017-04-29 NOTE — Assessment & Plan Note (Signed)
Suspect diverticulitis. Rx for augmentin 1 week therapy. Checking CBC and CMP and lipase to rule out other causes. Advised to try tylenol for pain otc.

## 2017-05-19 ENCOUNTER — Telehealth: Payer: Self-pay | Admitting: Internal Medicine

## 2017-05-19 NOTE — Telephone Encounter (Signed)
Copied from [place] [id number]. Topic: Quick Communication - Rx Refill/Question >> May 19, 2017  3:49 PM Yvette Rack wrote: Medication: atorvastatin (LIPITOR) 10 MG tablet   Has the patient contacted their pharmacy? No.   (Agent: If no, request that the patient contact the pharmacy for the refill.)   Preferred Pharmacy (with phone number or street name): [facility], [place] [phone] (Phone) [phone] (Fax     Agent: Please be advised that RX refills may take up to 3 business days. We ask that you follow-up with your pharmacy.

## 2017-05-20 ENCOUNTER — Other Ambulatory Visit: Payer: Self-pay

## 2017-05-20 MED ORDER — ATORVASTATIN CALCIUM 10 MG PO TABS: 10.0000 mg | ORAL_TABLET | Freq: Every day | ORAL | 3 refills | Status: DC

## 2017-08-23 ENCOUNTER — Other Ambulatory Visit: Payer: Self-pay | Admitting: Internal Medicine

## 2017-08-26 DIAGNOSIS — Z01419 Encounter for gynecological examination (general) (routine) without abnormal findings: Secondary | ICD-10-CM | POA: Diagnosis not present

## 2017-08-26 DIAGNOSIS — N950 Postmenopausal bleeding: Secondary | ICD-10-CM | POA: Insufficient documentation

## 2017-08-26 DIAGNOSIS — Z1231 Encounter for screening mammogram for malignant neoplasm of breast: Secondary | ICD-10-CM | POA: Diagnosis not present

## 2017-08-26 DIAGNOSIS — Z124 Encounter for screening for malignant neoplasm of cervix: Secondary | ICD-10-CM | POA: Diagnosis not present

## 2017-08-30 ENCOUNTER — Encounter: Payer: Self-pay | Admitting: Internal Medicine

## 2017-09-19 DIAGNOSIS — N950 Postmenopausal bleeding: Secondary | ICD-10-CM | POA: Diagnosis not present

## 2017-10-10 ENCOUNTER — Encounter: Payer: Self-pay | Admitting: Internal Medicine

## 2017-10-10 ENCOUNTER — Ambulatory Visit (INDEPENDENT_AMBULATORY_CARE_PROVIDER_SITE_OTHER): Payer: 59 | Admitting: Internal Medicine

## 2017-10-10 VITALS — BP 114/72 | HR 56 | Temp 98.5°F | Ht 67.0 in | Wt 203.0 lb

## 2017-10-10 DIAGNOSIS — L219 Seborrheic dermatitis, unspecified: Secondary | ICD-10-CM | POA: Insufficient documentation

## 2017-10-10 DIAGNOSIS — H00015 Hordeolum externum left lower eyelid: Secondary | ICD-10-CM

## 2017-10-10 DIAGNOSIS — E785 Hyperlipidemia, unspecified: Secondary | ICD-10-CM | POA: Diagnosis not present

## 2017-10-10 DIAGNOSIS — Z0000 Encounter for general adult medical examination without abnormal findings: Secondary | ICD-10-CM

## 2017-10-10 DIAGNOSIS — E538 Deficiency of other specified B group vitamins: Secondary | ICD-10-CM

## 2017-10-10 DIAGNOSIS — E559 Vitamin D deficiency, unspecified: Secondary | ICD-10-CM

## 2017-10-10 DIAGNOSIS — H00019 Hordeolum externum unspecified eye, unspecified eyelid: Secondary | ICD-10-CM | POA: Insufficient documentation

## 2017-10-10 MED ORDER — ATORVASTATIN CALCIUM 10 MG PO TABS: 10.0000 mg | ORAL_TABLET | Freq: Every day | ORAL | 3 refills | Status: DC

## 2017-10-10 MED ORDER — DOXYCYCLINE HYCLATE 100 MG PO TABS: 100.0000 mg | ORAL_TABLET | Freq: Two times a day (BID) | ORAL | 0 refills | Status: DC

## 2017-10-10 MED ORDER — ERYTHROMYCIN 5 MG/GM OP OINT: 1.0000 "application " | TOPICAL_OINTMENT | Freq: Every day | OPHTHALMIC | 0 refills | Status: DC

## 2017-10-10 MED ORDER — HYDROCORTISONE 2.5 % RE CREA: TOPICAL_CREAM | RECTAL | 3 refills | Status: DC

## 2017-10-10 NOTE — Patient Instructions (Signed)
Cortaid cream as needed on rash

## 2017-10-10 NOTE — Assessment & Plan Note (Signed)
On Vit D 

## 2017-10-10 NOTE — Assessment & Plan Note (Signed)
Cortaid cream as needed on rash

## 2017-10-10 NOTE — Assessment & Plan Note (Signed)
Lipitor Labs 

## 2017-10-10 NOTE — Assessment & Plan Note (Signed)
On B12 

## 2017-10-10 NOTE — Progress Notes (Signed)
Subjective:  Patient ID: Teresa Hubbard, female    DOB: Mar 12, 1961  Age: 57 y.o. MRN: [id number]  CC: No chief complaint on file.   HPI Teresa Hubbard presents for a well exam C/o L eyelid pain - pimple Rash on face  Outpatient Medications Prior to Visit  Medication Sig Dispense Refill  . amoxicillin-clavulanate (AUGMENTIN) 875-125 MG tablet Take 1 tablet by mouth 2 (two) times daily. 14 tablet 0  . atorvastatin (LIPITOR) 10 MG tablet Take 1 tablet (10 mg total) by mouth daily. 90 tablet 3  . Cyanocobalamin (VITAMIN B-12) 1000 MCG SUBL Place 1 tablet (1,000 mcg total) under the tongue daily. 100 tablet 3  . hydrocortisone (ANUSOL-HC) 2.5 % rectal cream Use at bedtime X 7 days 30 g 0  . meloxicam (MOBIC) 15 MG tablet Take 1 tablet (15 mg total) by mouth daily. 30 tablet 0  . methocarbamol (ROBAXIN) 500 MG tablet Take 1 tablet (500 mg total) by mouth every 8 (eight) hours as needed for muscle spasms. 21 tablet 0   No facility-administered medications prior to visit.     ROS: Review of Systems  Constitutional: Negative for activity change, appetite change, chills, fatigue and unexpected weight change.  HENT: Negative for congestion, mouth sores and sinus pressure.   Eyes: Negative for visual disturbance.  Respiratory: Negative for cough and chest tightness.   Gastrointestinal: Negative for abdominal pain and nausea.  Genitourinary: Negative for difficulty urinating, frequency and vaginal pain.  Musculoskeletal: Negative for back pain and gait problem.  Skin: Positive for rash. Negative for pallor.  Neurological: Negative for dizziness, tremors, weakness, numbness and headaches.  Psychiatric/Behavioral: Negative for confusion and sleep disturbance.    Objective:  BP 114/72 (BP Location: Left Arm, Patient Position: Sitting, Cuff Size: Large)   Pulse (!) 56   Temp 98.5 F (36.9 C) (Oral)   Ht 5\' 7"  (1.702 m)   Wt 203 lb (92.1 kg)   SpO2 97%   BMI 31.79 kg/m   BP Readings from  Last 3 Encounters:  10/10/17 114/72  04/28/17 110/70  03/14/17 118/70    Wt Readings from Last 3 Encounters:  10/10/17 203 lb (92.1 kg)  04/28/17 204 lb (92.5 kg)  03/14/17 207 lb (93.9 kg)    Physical Exam  Constitutional: She appears well-developed. No distress.  HENT:  Head: Normocephalic.  Right Ear: External ear normal.  Left Ear: External ear normal.  Nose: Nose normal.  Mouth/Throat: Oropharynx is clear and moist.  Eyes: Pupils are equal, round, and reactive to light. Conjunctivae are normal. Right eye exhibits no discharge. Left eye exhibits no discharge.  Neck: Normal range of motion. Neck supple. No JVD present. No tracheal deviation present. No thyromegaly present.  Cardiovascular: Normal rate, regular rhythm and normal heart sounds.  Pulmonary/Chest: No stridor. No respiratory distress. She has no wheezes.  Abdominal: Soft. Bowel sounds are normal. She exhibits no distension and no mass. There is no tenderness. There is no rebound and no guarding.  Musculoskeletal: She exhibits no edema or tenderness.  Lymphadenopathy:    She has no cervical adenopathy.  Neurological: She displays normal reflexes. No cranial nerve deficit. She exhibits normal muscle tone. Coordination normal.  Skin: No rash noted. No erythema.  Psychiatric: She has a normal mood and affect. Her behavior is normal. Judgment and thought content normal.  SD face L lower eyelid stye  Lab Results  Component Value Date   WBC 5.3 04/28/2017   HGB 14.5 04/28/2017  HCT 42.2 04/28/2017   PLT 276.0 04/28/2017   GLUCOSE 92 04/28/2017   CHOL 224 (H) 06/22/2016   TRIG 100.0 06/22/2016   HDL 52.10 06/22/2016   LDLDIRECT 226.0 03/27/2013   LDLCALC 152 (H) 06/22/2016   ALT 30 04/28/2017   AST 18 04/28/2017   NA 137 04/28/2017   K 4.5 04/28/2017   CL 103 04/28/2017   CREATININE 0.83 04/28/2017   BUN 15 04/28/2017   CO2 29 04/28/2017   TSH 4.20 06/22/2016   PSA 0.00 (L) 01/22/2011    Dg Abd Acute  W/chest  Result Date: 09/02/2015 CLINICAL DATA:  Four days of left lower quadrant pain with intermittent constipation, history of diverticulosis and diverticulitis. EXAM: DG ABDOMEN ACUTE W/ 1V CHEST COMPARISON:  Abdominal pelvic CT scan of October 30, 2011 FINDINGS: The lungs are adequately inflated. There is no focal infiltrate. The heart and pulmonary vascularity are normal. The mediastinum is normal in width. There is no pleural effusion. The bony thorax is unremarkable. Within the abdomen the colonic stool burden is moderately increased. There is no small or large bowel obstructive pattern. There is stool and gas in the rectum. No free extraluminal gas collections are observed. No abnormal soft tissue calcifications are observed. The bony structures are unremarkable. IMPRESSION: Increase colonic stool burden consistent with clinical constipation. No evidence of bowel obstruction or ileus. No acute cardiopulmonary abnormality. Electronically Signed   By: David  Martinique M.D.   On: 09/02/2015 15:37    Assessment & Plan:   There are no diagnoses linked to this encounter.   No orders of the defined types were placed in this encounter.    Follow-up: No follow-ups on file.  Walker Kehr, MD

## 2017-10-10 NOTE — Assessment & Plan Note (Signed)
Doxy po Erythro oint

## 2017-10-25 ENCOUNTER — Telehealth: Payer: Self-pay

## 2017-10-25 NOTE — Telephone Encounter (Signed)
Patient needs to schedule nurse visit to get first shingrix--let Lemuel Boodram know when appt is made so that vaccines can be labeled

## 2017-11-24 ENCOUNTER — Other Ambulatory Visit (INDEPENDENT_AMBULATORY_CARE_PROVIDER_SITE_OTHER): Payer: 59

## 2017-11-24 DIAGNOSIS — E559 Vitamin D deficiency, unspecified: Secondary | ICD-10-CM

## 2017-11-24 DIAGNOSIS — E538 Deficiency of other specified B group vitamins: Secondary | ICD-10-CM

## 2017-11-24 DIAGNOSIS — Z0000 Encounter for general adult medical examination without abnormal findings: Secondary | ICD-10-CM

## 2017-11-24 LAB — LIPID PANEL
CHOL/HDL RATIO: 4
CHOLESTEROL: 231 mg/dL — AB (ref 0–200)
HDL: 57.1 mg/dL (ref >39.00)
LDL CALC: 157 mg/dL — AB (ref 0–99)
NonHDL: 173.83
TRIGLYCERIDES: 82 mg/dL (ref 0.0–149.0)
VLDL: 16.4 mg/dL (ref 0.0–40.0)

## 2017-11-24 LAB — BASIC METABOLIC PANEL
BUN: 18 mg/dL (ref 6–23)
CHLORIDE: 105 meq/L (ref 96–112)
CO2: 29 meq/L (ref 19–32)
Calcium: 9.7 mg/dL (ref 8.4–10.5)
Creatinine, Ser: 1 mg/dL (ref 0.40–1.20)
GFR: 60.87 mL/min (ref >60.00)
Glucose, Bld: 101 mg/dL — ABNORMAL HIGH (ref 70–99)
POTASSIUM: 4.8 meq/L (ref 3.5–5.1)
Sodium: 140 mEq/L (ref 135–145)

## 2017-11-24 LAB — CBC WITH DIFFERENTIAL/PLATELET
BASOS PCT: 2.6 % (ref 0.0–3.0)
Basophils Absolute: 0.1 10*3/uL (ref 0.0–0.1)
EOS PCT: 4.4 % (ref 0.0–5.0)
Eosinophils Absolute: 0.2 10*3/uL (ref 0.0–0.7)
HCT: 43 % (ref 36.0–46.0)
Hemoglobin: 14.8 g/dL (ref 12.0–15.0)
LYMPHS ABS: 1.4 10*3/uL (ref 0.7–4.0)
Lymphocytes Relative: 38.5 % (ref 12.0–46.0)
MCHC: 34.4 g/dL (ref 30.0–36.0)
MCV: 89 fl (ref 78.0–100.0)
MONOS PCT: 8.2 % (ref 3.0–12.0)
Monocytes Absolute: 0.3 10*3/uL (ref 0.1–1.0)
NEUTROS ABS: 1.7 10*3/uL (ref 1.4–7.7)
NEUTROS PCT: 46.3 % (ref 43.0–77.0)
PLATELETS: 236 10*3/uL (ref 150.0–400.0)
RBC: 4.83 Mil/uL (ref 3.87–5.11)
RDW: 12.8 % (ref 11.5–15.5)
WBC: 3.6 10*3/uL — ABNORMAL LOW (ref 4.0–10.5)

## 2017-11-24 LAB — VITAMIN D 25 HYDROXY (VIT D DEFICIENCY, FRACTURES): VITD: 44.96 ng/mL (ref 30.00–100.00)

## 2017-11-24 LAB — TSH: TSH: 1.78 u[IU]/mL (ref 0.35–4.50)

## 2017-11-24 LAB — URINALYSIS
Bilirubin Urine: NEGATIVE
Hgb urine dipstick: NEGATIVE
KETONES UR: NEGATIVE
Leukocytes, UA: NEGATIVE
Nitrite: NEGATIVE
PH: 6 (ref 5.0–8.0)
SPECIFIC GRAVITY, URINE: 1.025 (ref 1.000–1.030)
TOTAL PROTEIN, URINE-UPE24: NEGATIVE
Urine Glucose: NEGATIVE
Urobilinogen, UA: 0.2 (ref 0.0–1.0)

## 2017-11-24 LAB — HEPATIC FUNCTION PANEL
ALBUMIN: 4.4 g/dL (ref 3.5–5.2)
ALK PHOS: 41 U/L (ref 39–117)
ALT: 29 U/L (ref 0–35)
AST: 16 U/L (ref 0–37)
BILIRUBIN DIRECT: 0.1 mg/dL (ref 0.0–0.3)
TOTAL PROTEIN: 7.5 g/dL (ref 6.0–8.3)
Total Bilirubin: 0.6 mg/dL (ref 0.2–1.2)

## 2017-11-24 LAB — VITAMIN B12: VITAMIN B 12: 1145 pg/mL — AB (ref 211–911)

## 2018-04-03 ENCOUNTER — Other Ambulatory Visit (INDEPENDENT_AMBULATORY_CARE_PROVIDER_SITE_OTHER): Payer: 59

## 2018-04-03 ENCOUNTER — Ambulatory Visit: Payer: 59 | Admitting: Internal Medicine

## 2018-04-03 ENCOUNTER — Encounter: Payer: Self-pay | Admitting: Internal Medicine

## 2018-04-03 VITALS — BP 122/80 | HR 66 | Temp 98.3°F | Ht 67.0 in | Wt 207.0 lb

## 2018-04-03 DIAGNOSIS — E669 Obesity, unspecified: Secondary | ICD-10-CM | POA: Diagnosis not present

## 2018-04-03 DIAGNOSIS — M2550 Pain in unspecified joint: Secondary | ICD-10-CM | POA: Diagnosis not present

## 2018-04-03 DIAGNOSIS — E559 Vitamin D deficiency, unspecified: Secondary | ICD-10-CM | POA: Diagnosis not present

## 2018-04-03 DIAGNOSIS — E785 Hyperlipidemia, unspecified: Secondary | ICD-10-CM

## 2018-04-03 DIAGNOSIS — Z23 Encounter for immunization: Secondary | ICD-10-CM | POA: Diagnosis not present

## 2018-04-03 LAB — SEDIMENTATION RATE: Sed Rate: 13 mm/hr (ref 0–30)

## 2018-04-03 LAB — BASIC METABOLIC PANEL
BUN: 16 mg/dL (ref 6–23)
CHLORIDE: 102 meq/L (ref 96–112)
CO2: 28 meq/L (ref 19–32)
Calcium: 10 mg/dL (ref 8.4–10.5)
Creatinine, Ser: 0.82 mg/dL (ref 0.40–1.20)
GFR: 76.44 mL/min (ref >60.00)
GLUCOSE: 91 mg/dL (ref 70–99)
Potassium: 4.5 mEq/L (ref 3.5–5.1)
Sodium: 139 mEq/L (ref 135–145)

## 2018-04-03 LAB — CBC WITH DIFFERENTIAL/PLATELET
BASOS ABS: 0.1 10*3/uL (ref 0.0–0.1)
Basophils Relative: 2.1 % (ref 0.0–3.0)
Eosinophils Absolute: 0.2 10*3/uL (ref 0.0–0.7)
Eosinophils Relative: 3.9 % (ref 0.0–5.0)
HCT: 43.6 % (ref 36.0–46.0)
Hemoglobin: 14.8 g/dL (ref 12.0–15.0)
Lymphocytes Relative: 36.1 % (ref 12.0–46.0)
Lymphs Abs: 1.8 10*3/uL (ref 0.7–4.0)
MCHC: 34.1 g/dL (ref 30.0–36.0)
MCV: 88.5 fl (ref 78.0–100.0)
Monocytes Absolute: 0.4 10*3/uL (ref 0.1–1.0)
Monocytes Relative: 7.3 % (ref 3.0–12.0)
Neutro Abs: 2.5 10*3/uL (ref 1.4–7.7)
Neutrophils Relative %: 50.6 % (ref 43.0–77.0)
Platelets: 251 10*3/uL (ref 150.0–400.0)
RBC: 4.92 Mil/uL (ref 3.87–5.11)
RDW: 13.3 % (ref 11.5–15.5)
WBC: 5 10*3/uL (ref 4.0–10.5)

## 2018-04-03 LAB — LIPID PANEL
Cholesterol: 290 mg/dL — ABNORMAL HIGH (ref 0–200)
HDL: 56.5 mg/dL (ref >39.00)
NonHDL: 233.09
Total CHOL/HDL Ratio: 5
Triglycerides: 252 mg/dL — ABNORMAL HIGH (ref 0.0–149.0)
VLDL: 50.4 mg/dL — ABNORMAL HIGH (ref 0.0–40.0)

## 2018-04-03 LAB — TSH: TSH: 3.95 u[IU]/mL (ref 0.35–4.50)

## 2018-04-03 LAB — HEPATIC FUNCTION PANEL
ALT: 36 U/L — ABNORMAL HIGH (ref 0–35)
AST: 19 U/L (ref 0–37)
Albumin: 4.5 g/dL (ref 3.5–5.2)
Alkaline Phosphatase: 42 U/L (ref 39–117)
BILIRUBIN TOTAL: 0.5 mg/dL (ref 0.2–1.2)
Bilirubin, Direct: 0 mg/dL (ref 0.0–0.3)
Total Protein: 7.6 g/dL (ref 6.0–8.3)

## 2018-04-03 LAB — CK: Total CK: 98 U/L (ref 7–177)

## 2018-04-03 LAB — LDL CHOLESTEROL, DIRECT: Direct LDL: 203 mg/dL

## 2018-04-03 LAB — URIC ACID: URIC ACID, SERUM: 5.5 mg/dL (ref 2.4–7.0)

## 2018-04-03 MED ORDER — NABUMETONE 500 MG PO TABS: 500.0000 mg | ORAL_TABLET | Freq: Two times a day (BID) | ORAL | 3 refills | Status: DC | PRN

## 2018-04-03 MED ORDER — HYDROCORTISONE 2.5 % RE CREA: TOPICAL_CREAM | RECTAL | 3 refills | Status: DC

## 2018-04-03 NOTE — Assessment & Plan Note (Signed)
Vit D 

## 2018-04-03 NOTE — Progress Notes (Signed)
Subjective:  Patient ID: Teresa Hubbard, female    DOB: 1961/02/20  Age: 58 y.o. MRN: [id number]  CC: No chief complaint on file.   HPI TYONNA TALERICO presents for dyslipidemia f/u C/o arthralgia  - mother w/RA C/o wt gain C/o HSA on the right  Outpatient Medications Prior to Visit  Medication Sig Dispense Refill  . atorvastatin (LIPITOR) 10 MG tablet Take 1 tablet (10 mg total) by mouth daily. 90 tablet 3  . Cyanocobalamin (VITAMIN B-12) 1000 MCG SUBL Place 1 tablet (1,000 mcg total) under the tongue daily. 100 tablet 3  . erythromycin ophthalmic ointment Place 1 application into the left eye at bedtime. 3.5 g 0  . hydrocortisone (ANUSOL-HC) 2.5 % rectal cream Use at bedtime X 7 days 30 g 3  . meloxicam (MOBIC) 15 MG tablet Take 1 tablet (15 mg total) by mouth daily. 30 tablet 0  . methocarbamol (ROBAXIN) 500 MG tablet Take 1 tablet (500 mg total) by mouth every 8 (eight) hours as needed for muscle spasms. 21 tablet 0  . doxycycline (VIBRA-TABS) 100 MG tablet Take 1 tablet (100 mg total) by mouth 2 (two) times daily. 14 tablet 0   No facility-administered medications prior to visit.     ROS: Review of Systems  Constitutional: Positive for fatigue and unexpected weight change. Negative for activity change, appetite change, chills, diaphoresis and fever.  HENT: Negative for congestion, ear pain, facial swelling, hearing loss, mouth sores, nosebleeds, postnasal drip, rhinorrhea, sinus pressure, sneezing, sore throat, tinnitus and trouble swallowing.   Eyes: Negative for pain, discharge, redness, itching and visual disturbance.  Respiratory: Negative for cough, chest tightness, shortness of breath, wheezing and stridor.   Cardiovascular: Negative for chest pain, palpitations and leg swelling.  Gastrointestinal: Negative for abdominal distention, anal bleeding, blood in stool, constipation, diarrhea, nausea and rectal pain.  Genitourinary: Negative for difficulty urinating, dysuria, flank  pain, frequency, genital sores, hematuria, pelvic pain, urgency, vaginal bleeding and vaginal discharge.  Musculoskeletal: Positive for arthralgias. Negative for back pain, gait problem, joint swelling, neck pain and neck stiffness.  Skin: Negative.  Negative for rash.  Neurological: Negative for dizziness, tremors, seizures, syncope, speech difficulty, weakness, numbness and headaches.  Hematological: Negative for adenopathy. Does not bruise/bleed easily.  Psychiatric/Behavioral: Negative for behavioral problems, decreased concentration, dysphoric mood, sleep disturbance and suicidal ideas. The patient is not nervous/anxious.     Objective:  BP 122/80 (BP Location: Left Arm, Patient Position: Sitting, Cuff Size: Large)   Pulse 66   Temp 98.3 F (36.8 C) (Oral)   Ht 5\' 7"  (1.702 m)   Wt 207 lb (93.9 kg)   SpO2 98%   BMI 32.42 kg/m   BP Readings from Last 3 Encounters:  04/03/18 122/80  10/10/17 114/72  04/28/17 110/70    Wt Readings from Last 3 Encounters:  04/03/18 207 lb (93.9 kg)  10/10/17 203 lb (92.1 kg)  04/28/17 204 lb (92.5 kg)    Physical Exam Constitutional:      General: She is not in acute distress.    Appearance: She is well-developed.  HENT:     Head: Normocephalic.     Right Ear: External ear normal.     Left Ear: External ear normal.     Nose: Nose normal.  Eyes:     General:        Right eye: No discharge.        Left eye: No discharge.     Conjunctiva/sclera: Conjunctivae normal.  Pupils: Pupils are equal, round, and reactive to light.  Neck:     Musculoskeletal: Normal range of motion and neck supple.     Thyroid: No thyromegaly.     Vascular: No JVD.     Trachea: No tracheal deviation.  Cardiovascular:     Rate and Rhythm: Normal rate and regular rhythm.     Heart sounds: Normal heart sounds.  Pulmonary:     Effort: No respiratory distress.     Breath sounds: No stridor. No wheezing.  Abdominal:     General: Bowel sounds are normal.  There is no distension.     Palpations: Abdomen is soft. There is no mass.     Tenderness: There is no abdominal tenderness. There is no guarding or rebound.  Musculoskeletal:        General: No tenderness.  Lymphadenopathy:     Cervical: No cervical adenopathy.  Skin:    Findings: No erythema or rash.  Neurological:     Cranial Nerves: No cranial nerve deficit.     Motor: No abnormal muscle tone.     Coordination: Coordination normal.     Deep Tendon Reflexes: Reflexes normal.  Psychiatric:        Behavior: Behavior normal.        Thought Content: Thought content normal.        Judgment: Judgment normal.   obese Hands - painful  Lab Results  Component Value Date   WBC 3.6 (L) 11/24/2017   HGB 14.8 11/24/2017   HCT 43.0 11/24/2017   PLT 236.0 11/24/2017   GLUCOSE 101 (H) 11/24/2017   CHOL 231 (H) 11/24/2017   TRIG 82.0 11/24/2017   HDL 57.10 11/24/2017   LDLDIRECT 226.0 03/27/2013   LDLCALC 157 (H) 11/24/2017   ALT 29 11/24/2017   AST 16 11/24/2017   NA 140 11/24/2017   K 4.8 11/24/2017   CL 105 11/24/2017   CREATININE 1.00 11/24/2017   BUN 18 11/24/2017   CO2 29 11/24/2017   TSH 1.78 11/24/2017   PSA 0.00 (L) 01/22/2011    Dg Abd Acute W/chest  Result Date: 09/02/2015 CLINICAL DATA:  Four days of left lower quadrant pain with intermittent constipation, history of diverticulosis and diverticulitis. EXAM: DG ABDOMEN ACUTE W/ 1V CHEST COMPARISON:  Abdominal pelvic CT scan of October 30, 2011 FINDINGS: The lungs are adequately inflated. There is no focal infiltrate. The heart and pulmonary vascularity are normal. The mediastinum is normal in width. There is no pleural effusion. The bony thorax is unremarkable. Within the abdomen the colonic stool burden is moderately increased. There is no small or large bowel obstructive pattern. There is stool and gas in the rectum. No free extraluminal gas collections are observed. No abnormal soft tissue calcifications are observed. The  bony structures are unremarkable. IMPRESSION: Increase colonic stool burden consistent with clinical constipation. No evidence of bowel obstruction or ileus. No acute cardiopulmonary abnormality. Electronically Signed   By: David  Martinique M.D.   On: 09/02/2015 15:37    Assessment & Plan:   There are no diagnoses linked to this encounter.   No orders of the defined types were placed in this encounter.    Follow-up: No follow-ups on file.  Walker Kehr, MD

## 2018-04-03 NOTE — Assessment & Plan Note (Addendum)
Lipitor cardiac CT scan for coronary calcium offered

## 2018-04-03 NOTE — Patient Instructions (Addendum)
Stop Lipitor for 2-3 weeks Standing desk mat Standing desk Yoga ball to sit on  Cardiac CT calcium scoring test $150   Computed tomography, more commonly known as a CT or CAT scan, is a diagnostic medical imaging test. Like traditional x-rays, it produces multiple images or pictures of the inside of the body. The cross-sectional images generated during a CT scan can be reformatted in multiple planes. They can even generate three-dimensional images. These images can be viewed on a computer monitor, printed on film or by a 3D printer, or transferred to a CD or DVD. CT images of internal organs, bones, soft tissue and blood vessels provide greater detail than traditional x-rays, particularly of soft tissues and blood vessels. A cardiac CT scan for coronary calcium is a non-invasive way of obtaining information about the presence, location and extent of calcified plaque in the coronary arteries-the vessels that supply oxygen-containing blood to the heart muscle. Calcified plaque results when there is a build-up of fat and other substances under the inner layer of the artery. This material can calcify which signals the presence of atherosclerosis, a disease of the vessel wall, also called coronary artery disease (CAD). People with this disease have an increased risk for heart attacks. In addition, over time, progression of plaque build up (CAD) can narrow the arteries or even close off blood flow to the heart. The result may be chest pain, sometimes called "angina," or a heart attack. Because calcium is a marker of CAD, the amount of calcium detected on a cardiac CT scan is a helpful prognostic tool. The findings on cardiac CT are expressed as a calcium score. Another name for this test is coronary artery calcium scoring.  What are some common uses of the procedure? The goal of cardiac CT scan for calcium scoring is to determine if CAD is present and to what extent, even if there are no symptoms. It is a  screening study that may be recommended by a physician for patients with risk factors for CAD but no clinical symptoms. The major risk factors for CAD are: . high blood cholesterol levels  . family history of heart attacks  . diabetes  . high blood pressure  . cigarette smoking  . overweight or obese  . physical inactivity   A negative cardiac CT scan for calcium scoring shows no calcification within the coronary arteries. This suggests that CAD is absent or so minimal it cannot be seen by this technique. The chance of having a heart attack over the next two to five years is very low under these circumstances. A positive test means that CAD is present, regardless of whether or not the patient is experiencing any symptoms. The amount of calcification-expressed as the calcium score-may help to predict the likelihood of a myocardial infarction (heart attack) in the coming years and helps your medical doctor or cardiologist decide whether the patient may need to take preventive medicine or undertake other measures such as diet and exercise to lower the risk for heart attack. The extent of CAD is graded according to your calcium score:  Calcium Score  Presence of CAD  0 No evidence of CAD   1-10 Minimal evidence of CAD  11-100 Mild evidence of CAD  101-400 Moderate evidence of CAD  Over 400 Extensive evidence of CAD

## 2018-04-03 NOTE — Assessment & Plan Note (Signed)
Hold Lipitor Labs Relafen

## 2018-04-04 LAB — RHEUMATOID FACTOR: Rheumatoid fact SerPl-aCnc: <14 IU/mL (ref <14)

## 2018-04-07 ENCOUNTER — Encounter: Payer: Self-pay | Admitting: Family

## 2018-04-07 ENCOUNTER — Ambulatory Visit (INDEPENDENT_AMBULATORY_CARE_PROVIDER_SITE_OTHER): Payer: 59 | Admitting: Family

## 2018-04-07 VITALS — BP 118/76 | HR 58 | Temp 98.3°F | Ht 67.0 in | Wt 203.0 lb

## 2018-04-07 DIAGNOSIS — J029 Acute pharyngitis, unspecified: Secondary | ICD-10-CM

## 2018-04-07 MED ORDER — FLUTICASONE PROPIONATE 50 MCG/ACT NA SUSP: 2.0000 | Freq: Every day | NASAL | 6 refills | Status: DC

## 2018-04-07 MED ORDER — CEFDINIR 300 MG PO CAPS: 300.0000 mg | ORAL_CAPSULE | Freq: Two times a day (BID) | ORAL | 0 refills | Status: DC

## 2018-04-07 NOTE — Patient Instructions (Signed)
Please try using Claritin 10 mg and the Flonase for the sore throat; if no improvement in 48-72 hours, you can start the antibiotic.  Please stay off the Lipitor for now; let Dr. Alain Marion know how you feel within 2 months; get the CT test;  Good luck with the weight loss management program

## 2018-04-07 NOTE — Progress Notes (Signed)
Teresa Hubbard is a 58 y.o. female with the following history as recorded in [employer]:  Patient Active Problem List   Diagnosis Date Noted  . Arthralgia 04/03/2018  . Seborrheic dermatitis 10/10/2017  . Stye 10/10/2017  . Plantar fasciitis, bilateral 08/26/2016  . Hemorrhoids 06/21/2016  . Colon polyps 06/21/2016  . Rash 06/21/2016  . Abdominal tenderness of left lower quadrant 09/02/2015  . Herpes zoster 09/02/2015  . Diverticulitis of large intestine without perforation or abscess without bleeding 09/02/2015  . Obesity 06/21/2015  . Hot flashes 06/07/2012  . Abdominal pain, unspecified site 06/06/2012  . Strep throat 03/01/2012  . Diverticulitis 11/01/2011  . Abdominal pain, other specified site 10/29/2011  . Well adult exam 01/26/2011  . Cystitis 01/26/2011  . Dyslipidemia 01/26/2011  . Left conjunctivitis 12/14/2010  . LOW BACK PAIN, ACUTE 05/26/2010  . HEMATOCHEZIA 08/13/2008  . Neoplasm of uncertain behavior of skin 08/18/2007  . B12 deficiency 08/18/2007  . Vitamin D deficiency 08/18/2007  . Diverticulosis of colon 02/17/2007  . FATIGUE 02/17/2007    Current Outpatient Medications  Medication Sig Dispense Refill  . Cyanocobalamin (VITAMIN B-12) 1000 MCG SUBL Place 1 tablet (1,000 mcg total) under the tongue daily. 100 tablet 3  . erythromycin ophthalmic ointment Place 1 application into the left eye at bedtime. 3.5 g 0  . hydrocortisone (ANUSOL-HC) 2.5 % rectal cream Use at bedtime X 7 days 60 g 3  . nabumetone (RELAFEN) 500 MG tablet Take 1 tablet (500 mg total) by mouth 2 (two) times daily as needed for moderate pain (w/food). 60 tablet 3  . atorvastatin (LIPITOR) 10 MG tablet Take 1 tablet (10 mg total) by mouth daily. (Patient not taking: Reported on 04/07/2018) 90 tablet 3  . cefdinir (OMNICEF) 300 MG capsule Take 1 capsule (300 mg total) by mouth 2 (two) times daily. 20 capsule 0  . fluticasone (FLONASE) 50 MCG/ACT nasal spray Place 2 sprays into both nostrils  daily. 16 g 6   No current facility-administered medications for this visit.     Allergies: Patient has no known allergies.  Past Medical History:  Diagnosis Date  . B12 DEFICIENCY 08/18/2007  . DIVERTICULOSIS, COLON 02/17/2007    History reviewed. No pertinent surgical history.  Family History  Problem Relation Age of Onset  . Ovarian cancer Maternal Aunt   . Lung cancer Maternal Uncle     Social History   Tobacco Use  . Smoking status: Never Smoker  . Smokeless tobacco: Never Used  Substance Use Topics  . Alcohol use: Yes    Comment: wine occasional    Subjective:  Sore throat x 5 days; seemed to start after Shingrix injection given earlier this week; no fever; has not taken any OTC medications for symptoms relief; does have allergies; no sinus pain or pressure;  Has recently stopped Lipitor- does feel that myalgias may be improving;    Objective:  Vitals:   04/07/18 1321  BP: 118/76  Pulse: (!) 58  Temp: 98.3 F (36.8 C)  TempSrc: Oral  SpO2: 97%  Weight: 203 lb (92.1 kg)  Height: 5\' 7"  (1.702 m)    General: Well developed, well nourished, in no acute distress  Skin : Warm and dry.  Head: Normocephalic and atraumatic  Eyes: Sclera and conjunctiva clear; pupils round and reactive to light; extraocular movements intact  Ears: External normal; canals clear; tympanic membranes normal  Oropharynx: Pink, supple. No suspicious lesions  Neck: Supple without thyromegaly, adenopathy  Lungs: Respirations unlabored; clear to  auscultation bilaterally without wheeze, rales, rhonchi  CVS exam: normal rate and regular rhythm.  Neurologic: Alert and oriented; speech intact; face symmetrical; moves all extremities well; CNII-XII intact without focal deficit   Assessment:  1. Sore throat     Plan:  Reassurance; suspect viral or allergic; re-start Claritin and trial of Flonase; Rx for Omnicef- hold and fill if no improvement in 48-72 hours.    Return in about 2 months  (around 06/06/2018) for Shingrix #2.  No orders of the defined types were placed in this encounter.   Requested Prescriptions   Signed Prescriptions Disp Refills  . fluticasone (FLONASE) 50 MCG/ACT nasal spray 16 g 6    Sig: Place 2 sprays into both nostrils daily.  . cefdinir (OMNICEF) 300 MG capsule 20 capsule 0    Sig: Take 1 capsule (300 mg total) by mouth 2 (two) times daily.

## 2018-05-03 ENCOUNTER — Ambulatory Visit (INDEPENDENT_AMBULATORY_CARE_PROVIDER_SITE_OTHER)
Admission: RE | Admit: 2018-05-03 | Discharge: 2018-05-03 | Disposition: A | Discharge status: Home / Self Care | Payer: Self-pay | Source: Ambulatory Visit | Attending: Internal Medicine | Admitting: Internal Medicine

## 2018-05-03 DIAGNOSIS — E785 Hyperlipidemia, unspecified: Secondary | ICD-10-CM

## 2018-05-15 ENCOUNTER — Telehealth: Payer: Self-pay | Admitting: Internal Medicine

## 2018-05-15 NOTE — Telephone Encounter (Signed)
I am fine with it.  However, lose should ask her daughters if they would be comfortable seeing a female physician.  Otherwise, they can establish with Dr. Quay Burow or with Dr. Sharlet Salina. Thank you

## 2018-05-15 NOTE — Telephone Encounter (Signed)
Copied from [place]. Topic: General - Inquiry >> May 15, 2018  4:00 PM Teresa Hubbard wrote: Reason for CRM: Pt stated that her twin daughters will be turning 54 and she would like to request that Dr. Alain Marion be their PCP. He see this pt as well as her husband [name]. Please advise. [phone]

## 2018-05-15 NOTE — Telephone Encounter (Signed)
Dr Alain Marion, Patient states that her twin daughters will soon be turning 67 and would like to know if you would be willing to see them to establish care? (see message below)  Please advise.

## 2018-05-17 NOTE — Telephone Encounter (Signed)
They are good to see Dr Alain Marion.  Appointments have been scheduled.

## 2018-06-07 ENCOUNTER — Ambulatory Visit: Payer: 59

## 2018-06-08 ENCOUNTER — Encounter (INDEPENDENT_AMBULATORY_CARE_PROVIDER_SITE_OTHER): Payer: Self-pay

## 2018-06-08 ENCOUNTER — Other Ambulatory Visit: Payer: Self-pay

## 2018-06-20 ENCOUNTER — Encounter (INDEPENDENT_AMBULATORY_CARE_PROVIDER_SITE_OTHER): Payer: Self-pay

## 2018-06-20 ENCOUNTER — Ambulatory Visit (INDEPENDENT_AMBULATORY_CARE_PROVIDER_SITE_OTHER): Payer: Self-pay | Admitting: Bariatrics

## 2018-07-04 ENCOUNTER — Ambulatory Visit (INDEPENDENT_AMBULATORY_CARE_PROVIDER_SITE_OTHER): Payer: Self-pay | Admitting: Bariatrics

## 2018-07-10 ENCOUNTER — Telehealth: Payer: Self-pay

## 2018-07-10 NOTE — Telephone Encounter (Signed)
Copied from [facility] [phone]. Topic: Appointment Scheduling - Scheduling Inquiry for Clinic >> Jul 07, 2018  1:39 PM Teresa Hubbard wrote: Reason for CRM: Pt called to schedule her second shingles vaccine. Please advise  I have talked with patient and scheduled for late June/2020

## 2018-08-16 ENCOUNTER — Ambulatory Visit (INDEPENDENT_AMBULATORY_CARE_PROVIDER_SITE_OTHER): Payer: 59 | Admitting: Internal Medicine

## 2018-08-16 ENCOUNTER — Telehealth: Payer: Self-pay | Admitting: Internal Medicine

## 2018-08-16 ENCOUNTER — Encounter: Payer: Self-pay | Admitting: Internal Medicine

## 2018-08-16 DIAGNOSIS — F5104 Psychophysiologic insomnia: Secondary | ICD-10-CM

## 2018-08-16 DIAGNOSIS — G43909 Migraine, unspecified, not intractable, without status migrainosus: Secondary | ICD-10-CM | POA: Insufficient documentation

## 2018-08-16 DIAGNOSIS — R51 Headache: Secondary | ICD-10-CM

## 2018-08-16 DIAGNOSIS — R519 Headache, unspecified: Secondary | ICD-10-CM

## 2018-08-16 DIAGNOSIS — G43009 Migraine without aura, not intractable, without status migrainosus: Secondary | ICD-10-CM

## 2018-08-16 DIAGNOSIS — M5481 Occipital neuralgia: Secondary | ICD-10-CM | POA: Diagnosis not present

## 2018-08-16 DIAGNOSIS — G4700 Insomnia, unspecified: Secondary | ICD-10-CM | POA: Insufficient documentation

## 2018-08-16 MED ORDER — ZOLPIDEM TARTRATE 10 MG PO TABS: 5.0000 mg | ORAL_TABLET | Freq: Every evening | ORAL | 1 refills | Status: DC | PRN

## 2018-08-16 MED ORDER — RIZATRIPTAN BENZOATE 10 MG PO TABS: 10.0000 mg | ORAL_TABLET | Freq: Once | ORAL | 5 refills | Status: DC | PRN

## 2018-08-16 NOTE — Telephone Encounter (Signed)
Patient called stating that she is having continued headaches and right side numbness of the brain. This has been going on since January. She was seen on January 6th, 2020. Virtual visit scheduled for this afternoon.

## 2018-08-16 NOTE — Assessment & Plan Note (Signed)
brain numbness described by the patient is likely decided to right-sided occipital neuralgia.  Stretching exercises, massage.  We can do nerve block if needed

## 2018-08-16 NOTE — Assessment & Plan Note (Signed)
Multifactorial.  Will use Ambien intermittently with caution.

## 2018-08-16 NOTE — Telephone Encounter (Signed)
See 08/16/18 OV note.

## 2018-08-16 NOTE — Progress Notes (Signed)
Virtual Visit via Video Note  I connected with [name] on 08/16/18 at  3:40 PM EDT by a video enabled telemedicine application and verified that I am speaking with the correct person using two identifiers.   I discussed the limitations of evaluation and management by telemedicine and the availability of in person appointments. The patient expressed understanding and agreed to proceed.  History of Present Illness: Teresa Hubbard is complaining of all of persistence severe headaches that she rates at 6-7 out of 10 in intensity on both sides of her head.  She has no previous history of headaches.  She denies nausea vomiting, double vision, weakness of the extremities.  No syncope. The patient is complaining of numbness on the right side of her brain. The patient is complaining of insomnia.  At best she gets 5 hours of sleep.  She has been working a lot from home, more than usual.  Say computer work.  She has been tired.  There has been no runny nose, cough, chest pain, shortness of breath, abdominal pain, diarrhea, constipation, arthralgias, skin rashes.   Observations/Objective: The patient appears to be in no acute distress, looks well.  Assessment and Plan:  See my Assessment and Plan. Follow Up Instructions:    I discussed the assessment and treatment plan with the patient. The patient was provided an opportunity to ask questions and all were answered. The patient agreed with the plan and demonstrated an understanding of the instructions.   The patient was advised to call back or seek an in-person evaluation if the symptoms worsen or if the condition fails to improve as anticipated.  I provided face-to-face time during this encounter. We were at different locations.   Walker Kehr, MD

## 2018-08-16 NOTE — Assessment & Plan Note (Signed)
New, worsening.  Neck exercises.  Comfortable pillow.  Maxalt po PRN.  Ibuprofen as needed  Obtain brain MRI due to worsening nature of the headaches  Treat insomnia

## 2018-08-23 ENCOUNTER — Encounter (INDEPENDENT_AMBULATORY_CARE_PROVIDER_SITE_OTHER): Payer: Self-pay | Admitting: Family Medicine

## 2018-08-23 ENCOUNTER — Other Ambulatory Visit: Payer: Self-pay

## 2018-08-23 ENCOUNTER — Ambulatory Visit (INDEPENDENT_AMBULATORY_CARE_PROVIDER_SITE_OTHER): Payer: 59 | Admitting: Family Medicine

## 2018-08-23 VITALS — BP 138/79 | HR 71 | Temp 98.3°F | Ht 67.0 in | Wt 201.0 lb

## 2018-08-23 DIAGNOSIS — R0602 Shortness of breath: Secondary | ICD-10-CM

## 2018-08-23 DIAGNOSIS — E669 Obesity, unspecified: Secondary | ICD-10-CM

## 2018-08-23 DIAGNOSIS — Z0289 Encounter for other administrative examinations: Secondary | ICD-10-CM

## 2018-08-23 DIAGNOSIS — G4709 Other insomnia: Secondary | ICD-10-CM | POA: Diagnosis not present

## 2018-08-23 DIAGNOSIS — Z1331 Encounter for screening for depression: Secondary | ICD-10-CM

## 2018-08-23 DIAGNOSIS — R5383 Other fatigue: Secondary | ICD-10-CM

## 2018-08-23 DIAGNOSIS — E7849 Other hyperlipidemia: Secondary | ICD-10-CM | POA: Diagnosis not present

## 2018-08-23 DIAGNOSIS — Z9189 Other specified personal risk factors, not elsewhere classified: Secondary | ICD-10-CM

## 2018-08-23 DIAGNOSIS — Z6831 Body mass index (BMI) 31.0-31.9, adult: Secondary | ICD-10-CM

## 2018-08-23 NOTE — Progress Notes (Signed)
Office: [phone]  /  Fax: [phone]   Dear Dr. Alain Marion,   Thank you for referring Teresa Hubbard to our clinic. The following note includes my evaluation and treatment recommendations.  HPI:   Chief Complaint: OBESITY    Teresa Hubbard has been referred by Evie Lacks. Plotnikov, MD for consultation regarding her obesity and obesity related comorbidities.    MIKAH ROTTINGHAUS (MR# [id number]) is a 58 y.o. female who presents on 08/23/2018 for obesity evaluation and treatment. Current BMI is Body mass index is 31.48 kg/m.Marland Kitchen Teresa Hubbard has been struggling with her weight for many years and has been unsuccessful in either losing weight, maintaining weight loss, or reaching her healthy weight goal.      [name] is only taking vitamins currently. She would like to be mostly vegetarian. She is often forgetting to eat during the day.     Teresa Hubbard attended our information session and states she is currently in the action stage of change and ready to dedicate time achieving and maintaining a healthier weight. Teresa Hubbard is interested in becoming our patient and working on intensive lifestyle modifications including (but not limited to) diet, exercise and weight loss.    Teresa Hubbard states her family eats meals together she thinks her family will eat healthier with  her her desired weight loss is 31 lbs she started gaining weight during the past 5 years her heaviest weight ever was 201 lbs she skips meals frequently she is frequently drinking liquids with calories she frequently eats larger portions than normal  she struggles with emotional eating    Fatigue Teresa Hubbard feels her energy is lower than it should be. This has worsened with weight gain and has not worsened recently. Teresa Hubbard admits to daytime somnolence and  admits to waking up still tired. Patient is at risk for obstructive sleep apnea. Patent has a history of symptoms of daytime fatigue and morning headache. Patient generally gets 5 hours of sleep per night, and states they  generally have difficulty falling asleep. Snoring is present. Apneic episodes are not present. Epworth Sleepiness Score is 10.  Dyspnea on exertion Teresa Hubbard notes increasing shortness of breath with exercising and seems to be worsening over time with weight gain. She notes getting out of breath sooner with activity than she used to. This has not gotten worse recently. EKG-inverted T waves, V1-V3, normal sinus rhythm. Aeon denies orthopnea.  Hyperlipidemia Teresa Hubbard has hyperlipidemia and has been trying to improve her cholesterol levels with intensive lifestyle modification including a low saturated fat diet, exercise and weight loss. Last LDL was of 230 in January 2020. She was previously on statin, but stopped secondary to side effects. She denies any chest pain, claudication or myalgias.  At risk for cardiovascular disease Teresa Hubbard is at a higher than average risk for cardiovascular disease due to obesity and hyperlipidemia. She currently denies any chest pain.  Insomnia Teresa Hubbard complains of insomnia and has difficulty falling asleep. She has previously taken melatonin. She states she only sleeps 5 hours per night on average.    Depression Screen Teresa Hubbard's Food and Mood (modified PHQ-9) score was  Depression screen PHQ 2/9 08/23/2018  Decreased Interest 1  Down, Depressed, Hopeless 1  PHQ - 2 Score 2  Altered sleeping 0  Tired, decreased energy 1  Change in appetite 1  Feeling bad or failure about yourself  0  Trouble concentrating 0  Moving slowly or fidgety/restless 0  Suicidal thoughts 0  PHQ-9 Score 4  Difficult doing work/chores Not difficult  at all    ASSESSMENT AND PLAN:  Other fatigue - Plan: EKG 12-Lead, Vitamin B12, CBC With Differential, Comprehensive metabolic panel, Folate, Hemoglobin A1c, Insulin, random, T3, T4, free, TSH, VITAMIN D 25 Hydroxy (Vit-D Deficiency, Fractures)  Shortness of breath on exertion  Other hyperlipidemia - Plan: Lipid Panel With LDL/HDL Ratio  Other insomnia   Depression screening  At risk for heart disease  Class 1 obesity with serious comorbidity and body mass index (BMI) of 31.0 to 31.9 in adult, unspecified obesity type  PLAN:  Fatigue Teresa Hubbard was informed that her fatigue may be related to obesity, depression or many other causes. Labs will be ordered, and in the meanwhile Teresa Hubbard has agreed to work on diet, exercise and weight loss to help with fatigue. Proper sleep hygiene was discussed including the need for 7-8 hours of quality sleep each night. A sleep study was not ordered based on symptoms and Epworth score.  Dyspnea on exertion Teresa Hubbard's shortness of breath appears to be obesity related and exercise induced. She has agreed to work on weight loss and gradually increase exercise to treat her exercise induced shortness of breath. If Teresa Hubbard follows our instructions and loses weight without improvement of her shortness of breath, we will plan to refer to pulmonology. We will monitor this condition regularly. Teresa Hubbard agrees to this plan.  Hyperlipidemia Branden was informed of the American Heart Association Guidelines emphasizing intensive lifestyle modifications as the first line treatment for hyperlipidemia. We discussed many lifestyle modifications today in depth, and Shekita will continue to work on decreasing saturated fats such as fatty red meat, butter and many fried foods. She will also increase vegetables and lean protein in her diet and continue to work on exercise and weight loss efforts. We will check FLP today. Sabriel agrees to follow up with our clinic in 2 weeks.  Cardiovascular risk counseling Teresa Hubbard was given extended (15 minutes) coronary artery disease prevention counseling today. She is 58 y.o. female and has risk factors for heart disease including obesity and hyperlipidemia. We discussed intensive lifestyle modifications today with an emphasis on specific weight loss instructions and strategies. Pt was also informed of the importance of increasing exercise  and decreasing saturated fats to help prevent heart disease.  Insomnia The problem of recurrent insomnia was discussed. Avoidance of caffeine sources was strongly encouraged and sleep hygiene issues were reviewed. A sleep handout was also given. We will follow up at her next appointment. Zeriyah agrees to follow up with our clinic in 2 weeks.  Depression Screen Cami had a negative depression screening. Depression is commonly associated with obesity and often results in emotional eating behaviors. We will monitor this closely and work on CBT to help improve the non-hunger eating patterns. Referral to Psychology may be required if no improvement is seen as she continues in our clinic.  Obesity Melina is currently in the action stage of change and her goal is to continue with weight loss efforts. I recommend Arleth begin the structured treatment plan as follows:  She has agreed to follow our [place] has been instructed to eventually work up to a goal of 150 minutes of combined cardio and strengthening exercise per week for weight loss and overall health benefits. We discussed the following Behavioral Modification Strategies today: increasing lean protein intake, increasing vegetables and work on meal planning and easy cooking plans, keeping healthy foods in the home, and planning for success   She was informed of the importance of frequent follow up  visits to maximize her success with intensive lifestyle modifications for her multiple health conditions. She was informed we would discuss her lab results at her next visit unless there is a critical issue that needs to be addressed sooner. Katharine agreed to keep her next visit at the agreed upon time to discuss these results.  ALLERGIES: No Known Allergies  MEDICATIONS: Current Outpatient Medications on File Prior to Visit  Medication Sig Dispense Refill  . Cyanocobalamin (VITAMIN B-12) 1000 MCG SUBL Place 1 tablet (1,000 mcg total) under the tongue  daily. 100 tablet 3   No current facility-administered medications on file prior to visit.     PAST MEDICAL HISTORY: Past Medical History:  Diagnosis Date  . B12 DEFICIENCY 08/18/2007  . DIVERTICULOSIS, COLON 02/17/2007  . High cholesterol   . Joint pain   . Lack of energy   . Weight gain     PAST SURGICAL HISTORY: History reviewed. No pertinent surgical history.  SOCIAL HISTORY: Social History   Tobacco Use  . Smoking status: Never Smoker  . Smokeless tobacco: Never Used  Substance Use Topics  . Alcohol use: Yes    Comment: wine occasional  . Drug use: No    FAMILY HISTORY: Family History  Problem Relation Age of Onset  . High blood pressure Mother   . High blood pressure Father   . High Cholesterol Father   . Ovarian cancer Maternal Aunt   . Lung cancer Maternal Uncle     ROS: Review of Systems  Constitutional: Positive for malaise/fatigue. Negative for weight loss.       + Trouble sleeping  Eyes:       + Wear glasses or contacts  Respiratory: Positive for shortness of breath (with exertion).   Cardiovascular: Negative for chest pain, orthopnea and claudication.  Musculoskeletal: Positive for back pain. Negative for myalgias.       + Muscle or joint pain + Muscle stiffness + Neck stiffness  Skin:       + Dryness  Neurological: Positive for headaches.  Endo/Heme/Allergies:       + Excessive hunger  Psychiatric/Behavioral: The patient has insomnia.        + Stress    PHYSICAL EXAM: Blood pressure 138/79, pulse 71, temperature 98.3 F (36.8 C), temperature source Oral, height 5\' 7"  (1.702 m), weight 201 lb (91.2 kg), SpO2 100 %. Body mass index is 31.48 kg/m. Physical Exam Vitals signs reviewed.  Constitutional:      Appearance: Normal appearance. She is obese.  HENT:     Head: Normocephalic and atraumatic.     Nose: Nose normal.  Eyes:     General: No scleral icterus.    Extraocular Movements: Extraocular movements intact.  Neck:      Musculoskeletal: Normal range of motion and neck supple.     Comments: No thyromegaly present Cardiovascular:     Rate and Rhythm: Normal rate and regular rhythm.     Pulses: Normal pulses.     Heart sounds: Normal heart sounds.  Pulmonary:     Effort: Pulmonary effort is normal. No respiratory distress.     Breath sounds: Normal breath sounds.  Abdominal:     Palpations: Abdomen is soft.     Tenderness: There is no abdominal tenderness.     Comments: + Obesity  Musculoskeletal: Normal range of motion.     Right lower leg: No edema.     Left lower leg: No edema.  Skin:    General: Skin is  warm and dry.  Neurological:     Mental Status: She is alert and oriented to person, place, and time.     Coordination: Coordination normal.  Psychiatric:        Mood and Affect: Mood normal.        Behavior: Behavior normal.     RECENT LABS AND TESTS: BMET    Component Value Date/Time   NA 139 04/03/2018 1605   K 4.5 04/03/2018 1605   CL 102 04/03/2018 1605   CO2 28 04/03/2018 1605   GLUCOSE 91 04/03/2018 1605   BUN 16 04/03/2018 1605   CREATININE 0.82 04/03/2018 1605   CALCIUM 10.0 04/03/2018 1605   GFRNONAA 82 (L) 10/30/2011 0235   GFRAA >90 10/30/2011 0235   No results found for: HGBA1C No results found for: INSULIN CBC    Component Value Date/Time   WBC 5.0 04/03/2018 1605   RBC 4.92 04/03/2018 1605   HGB 14.8 04/03/2018 1605   HCT 43.6 04/03/2018 1605   PLT 251.0 04/03/2018 1605   MCV 88.5 04/03/2018 1605   MCH 31.2 10/30/2011 0235   MCHC 34.1 04/03/2018 1605   RDW 13.3 04/03/2018 1605   LYMPHSABS 1.8 04/03/2018 1605   MONOABS 0.4 04/03/2018 1605   EOSABS 0.2 04/03/2018 1605   BASOSABS 0.1 04/03/2018 1605   Iron/TIBC/Ferritin/ %Sat No results found for: IRON, TIBC, FERRITIN, IRONPCTSAT Lipid Panel     Component Value Date/Time   CHOL 290 (H) 04/03/2018 1605   TRIG 252.0 (H) 04/03/2018 1605   HDL 56.50 04/03/2018 1605   CHOLHDL 5 04/03/2018 1605   VLDL 50.4  (H) 04/03/2018 1605   LDLCALC 157 (H) 11/24/2017 0923   LDLDIRECT 203.0 04/03/2018 1605   Hepatic Function Panel     Component Value Date/Time   PROT 7.6 04/03/2018 1605   ALBUMIN 4.5 04/03/2018 1605   AST 19 04/03/2018 1605   ALT 36 (H) 04/03/2018 1605   ALKPHOS 42 04/03/2018 1605   BILITOT 0.5 04/03/2018 1605   BILIDIR 0.0 04/03/2018 1605      Component Value Date/Time   TSH 3.95 04/03/2018 1605   TSH 1.78 11/24/2017 0923   TSH 4.20 06/22/2016 0945    ECG  shows NSR with a rate of 64 BPM INDIRECT CALORIMETER done today shows a VO2 of 241 and a REE of 1677.  Her calculated basal metabolic rate is 8850 thus her basal metabolic rate is worse than expected.       OBESITY BEHAVIORAL INTERVENTION VISIT  Today's visit was # 1   Starting weight: 201 lbs Starting date: 08/23/2018 Today's weight : 201 lbs  Today's date: 08/23/2018 Total lbs lost to date: 0    ASK: We discussed the diagnosis of obesity with Teresa Hubbard today and Shaira agreed to give [place] permission to discuss obesity behavioral modification therapy today.  ASSESS: Neeley has the diagnosis of obesity and her BMI today is 31.47 Ronit is in the action stage of change   ADVISE: Yeny was educated on the multiple health risks of obesity as well as the benefit of weight loss to improve her health. She was advised of the need for long term treatment and the importance of lifestyle modifications to improve her current health and to decrease her risk of future health problems.  AGREE: Multiple dietary modification options and treatment options were discussed and  Kyleeann agreed to follow the recommendations documented in the above note.  ARRANGE: Josaphine was educated on the importance of frequent visits to  treat obesity as outlined per CMS and USPSTF guidelines and agreed to schedule her next follow up appointment today.  I, Trixie Dredge, am acting as transcriptionist for Ilene Qua, MD  I have reviewed the above  documentation for accuracy and completeness, and I agree with the above. - Ilene Qua, MD

## 2018-08-24 LAB — TSH: TSH: 3.46 u[IU]/mL (ref 0.450–4.500)

## 2018-08-24 LAB — COMPREHENSIVE METABOLIC PANEL
ALT: 29 IU/L (ref 0–32)
AST: 17 IU/L (ref 0–40)
Albumin/Globulin Ratio: 1.8 (ref 1.2–2.2)
Albumin: 4.6 g/dL (ref 3.8–4.9)
Alkaline Phosphatase: 45 IU/L (ref 39–117)
BUN/Creatinine Ratio: 12 (ref 9–23)
BUN: 11 mg/dL (ref 6–24)
Bilirubin Total: 0.4 mg/dL (ref 0.0–1.2)
CO2: 24 mmol/L (ref 20–29)
Calcium: 9.8 mg/dL (ref 8.7–10.2)
Chloride: 101 mmol/L (ref 96–106)
Creatinine, Ser: 0.89 mg/dL (ref 0.57–1.00)
GFR calc Af Amer: 83 mL/min/{1.73_m2} (ref >59)
GFR calc non Af Amer: 72 mL/min/{1.73_m2} (ref >59)
Globulin, Total: 2.6 g/dL (ref 1.5–4.5)
Glucose: 92 mg/dL (ref 65–99)
Potassium: 4.8 mmol/L (ref 3.5–5.2)
Sodium: 142 mmol/L (ref 134–144)
Total Protein: 7.2 g/dL (ref 6.0–8.5)

## 2018-08-24 LAB — CBC WITH DIFFERENTIAL
Basophils Absolute: 0.1 10*3/uL (ref 0.0–0.2)
Basos: 2 %
EOS (ABSOLUTE): 0.2 10*3/uL (ref 0.0–0.4)
Eos: 6 %
Hematocrit: 42.8 % (ref 34.0–46.6)
Hemoglobin: 14.8 g/dL (ref 11.1–15.9)
Immature Grans (Abs): 0 10*3/uL (ref 0.0–0.1)
Immature Granulocytes: 0 %
Lymphocytes Absolute: 1.4 10*3/uL (ref 0.7–3.1)
Lymphs: 37 %
MCH: 30.3 pg (ref 26.6–33.0)
MCHC: 34.6 g/dL (ref 31.5–35.7)
MCV: 88 fL (ref 79–97)
Monocytes Absolute: 0.3 10*3/uL (ref 0.1–0.9)
Monocytes: 8 %
Neutrophils Absolute: 1.8 10*3/uL (ref 1.4–7.0)
Neutrophils: 47 %
RBC: 4.89 x10E6/uL (ref 3.77–5.28)
RDW: 12.7 % (ref 11.7–15.4)
WBC: 3.7 10*3/uL (ref 3.4–10.8)

## 2018-08-24 LAB — VITAMIN D 25 HYDROXY (VIT D DEFICIENCY, FRACTURES): Vit D, 25-Hydroxy: 29.8 ng/mL — ABNORMAL LOW (ref 30.0–100.0)

## 2018-08-24 LAB — FOLATE: Folate: 14.9 ng/mL (ref >3.0)

## 2018-08-24 LAB — LIPID PANEL WITH LDL/HDL RATIO
Cholesterol, Total: 333 mg/dL — ABNORMAL HIGH (ref 100–199)
HDL: 51 mg/dL (ref >39)
LDL Calculated: 248 mg/dL — ABNORMAL HIGH (ref 0–99)
LDl/HDL Ratio: 4.9 ratio — ABNORMAL HIGH (ref 0.0–3.2)
Triglycerides: 168 mg/dL — ABNORMAL HIGH (ref 0–149)
VLDL Cholesterol Cal: 34 mg/dL (ref 5–40)

## 2018-08-24 LAB — SPECIMEN STATUS REPORT

## 2018-08-24 LAB — HEMOGLOBIN A1C
Est. average glucose Bld gHb Est-mCnc: 108 mg/dL
Hgb A1c MFr Bld: 5.4 % (ref 4.8–5.6)

## 2018-08-24 LAB — T3: T3, Total: 97 ng/dL (ref 71–180)

## 2018-08-24 LAB — VITAMIN B12: Vitamin B-12: 1272 pg/mL — ABNORMAL HIGH (ref 232–1245)

## 2018-08-24 LAB — INSULIN, RANDOM: INSULIN: 13 u[IU]/mL (ref 2.6–24.9)

## 2018-08-24 LAB — T4, FREE: Free T4: 1.07 ng/dL (ref 0.82–1.77)

## 2018-09-02 ENCOUNTER — Ambulatory Visit
Admission: RE | Admit: 2018-09-02 | Discharge: 2018-09-02 | Disposition: A | Discharge status: Home / Self Care | Payer: 59 | Source: Ambulatory Visit | Attending: Internal Medicine | Admitting: Internal Medicine

## 2018-09-02 ENCOUNTER — Other Ambulatory Visit: Payer: Self-pay

## 2018-09-02 DIAGNOSIS — R519 Headache, unspecified: Secondary | ICD-10-CM

## 2018-09-02 DIAGNOSIS — G8929 Other chronic pain: Secondary | ICD-10-CM

## 2018-09-05 NOTE — Progress Notes (Signed)
Office: [phone]  /  Fax: [phone]    Date: September 06, 2018   Appointment Start Time: 9:06am Duration: 38 minutes Provider: Glennie Isle, Psy.D. Type of Session: Intake for Individual Therapy  Location of Patient: Home Location of Provider: Provider's Home Type of Contact: Telepsychological Visit via [employer] Web[employer]  Informed Consent: This provider called Geni at 9:01am to assist with connecting as today is the first appointment. She was unsure how to join; therefore, instructions were provided and the e-mail with the secure link was re-sent. As such, today's appointment was initiated 6 minutes late. Prior to proceeding with today's appointment, two pieces of identifying information were obtained from Jacyln to verify identity. In addition, Sarely's physical location at the time of this appointment was obtained. Brekyn reported she was at home and provided the address. In the event of technical difficulties, Shealynn shared a phone number she could be reached at. Sommer and this provider participated in today's telepsychological service. Also, Eudell initially reported her husband was in the room, but was receptive to moving to another location for confidentiality. She denied anyone else being present on the WebEx appointment.   The provider's role was explained to [employer] The provider reviewed and discussed issues of confidentiality, privacy, and limits therein (e.g., reporting obligations). In addition to verbal informed consent, written informed consent for psychological services was obtained from [place] prior to the initial intake interview. Written consent included information concerning the practice, financial arrangements, and confidentiality and patients' rights. Since the clinic is not a 24/7 crisis center, mental health emergency resources were shared, and the provider explained MyChart, e-mail, voicemail, and/or other messaging systems should be utilized only for non-emergency reasons. This provider  also explained that information obtained during appointments will be placed in Tasnim's medical record in a confidential manner and relevant information will be shared with other providers at [facility] that she meets with for coordination of care. Asheley verbally acknowledged understanding of the aforementioned, and agreed to use mental health emergency resources discussed if needed. Moreover, Kaleisha agreed information may be shared with other [facility] providers as needed for coordination of care. By signing the service agreement document, Nevayah provided written consent for coordination of care.   Prior to initiating telepsychological services, Eyvette was provided with an informed consent document, which included the development of a safety plan (i.e., an emergency contact and emergency resources) in the event of an emergency/crisis. Saachi expressed understanding of the rationale of the safety plan and provided consent for this provider to reach out to her emergency contact in the event of an emergency/crisis.Naseem returned the completed consent form prior to today's appointment. This provider verbally reviewed the consent form during today's appointment prior to proceeding with the appointment. Marveen verbally acknowledged understanding that she is ultimately responsible for understanding her insurance benefits as it relates to reimbursement of telepsychological services. This provider also reviewed confidentiality, as it relates to telepsychological services, as well as the rationale for telepsychological services. More specifically, this provider's clinic is limiting in-person visits due to COVID-19. Therapeutic services will resume to in-person appointments once deemed appropriate. Kandance expressed understanding regarding the rationale for telepsychological services. In addition, this provider explained the telepsychological services informed consent document would be considered an addendum to the  initial consent document/service agreement. Ophia verbally consented to proceed.   Chief Complaint/HPI: Ranay was referred by Dr. Ilene Qua on Aug 23, 2018. During the initial appointment with Dr. Ilene Qua at [facility] on Aug 23, 2018, Norberta reported experiencing the following: frequently drinking liquids with calories, frequently eating larger portions than normal , struggling with emotional eating and skipping meals frequently.   During today's appointment, Tanaja reported eating when she is stressed and noted, "I eat more than usual." She shared she enjoys cooking and eating good food. Jadesola was verbally administered a questionnaire assessing various behaviors related to emotional eating. Vonya endorsed the following: overeat when you are celebrating, eat certain foods when you are anxious, stressed, depressed, or your feelings are hurt, use food to help you cope with emotional situations and overeat when you are angry or upset. Lovina believes the onset of emotional eating was in her 49s. She does not believe she has engaged in emotional eating lately, and noted it was a concern "years ago." Additionally, Takeila reported changes in her weight starting 5-8 years ago due to "hormonal changes" and "menopause." Moreover, Gidget denied a history of restricting food intake, purging and engagement in other compensatory strategies, and has never been diagnosed with an eating disorder. She has also never received treatment for emotional eating. Laurey denied a history of binge eating, but acknowledged eating mindless eating. She reported she craves sweets, such as cake, ice cream, and chocolates. She added, "That's why I don't buy them."   At this time, Laekyn initially denied any other problems of concerns aside from emotional and mindless eating; however, she endorsed difficulty falling asleep and reported weight gain has resulted in worry about her physical appearance. She noted that being busy, working,  going out, and exercising helps with emotional eating, whereas, staying at home and being bored triggers emotional eating. Kinzee shared during the pandemic, she and her husband are trying to go for walks and stay busy. Furthermore, Julee reported she finds the structured meal plan 'boring" and expressed concern regarding her ability to "sustain it." Thus, it was recommended she discuss this concern with Dr. Adair Patter at the next appointment; she agreed.  Mental Status Examination:  Appearance: neat Behavior: cooperative Mood: euthymic Affect: mood congruent Speech: normal in rate, volume, and tone Eye Contact: appropriate Psychomotor Activity: appropriate Thought Process: linear, logical, and goal directed  Content/Perceptual Disturbances: denies suicidal and homicidal ideation, plan, and intent and no hallucinations, delusions, bizarre thinking or behavior reported or observed Orientation: time, person, place and purpose of appointment Cognition/Sensorium: memory, attention, language, and fund of knowledge intact  Insight: good Judgment: good  Family & Psychosocial History: Taisa reported, "I'm from a good family." She shared she has been married for 20 years and she has two twin daughters (ages 91) . 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 Additionally, Shatarra shared her highest level of education obtained is a master's degree. Currently, Mirabelle's social support system consists of her husband, daughters, and friends. Moreover, Orlanda stated she resides with her husband and daughters.   Medical History:  Past Medical History:  Diagnosis Date   B12 DEFICIENCY 08/18/2007   DIVERTICULOSIS, COLON 02/17/2007   High cholesterol    Joint pain    Lack of energy    Weight gain    No past surgical history on file. Current Outpatient Medications on File Prior to Visit  Medication Sig Dispense Refill   Cyanocobalamin (VITAMIN B-12) 1000 MCG SUBL Place 1 tablet  (1,000 mcg total) under the tongue daily. 100 tablet 3   No current facility-administered medications on file prior to visit.   Mairany denied a history  of head injuries and loss of consciousness.   Mental Health History: Dotty denied a history of treatment for mental health concerns, including therapeutic services. Channell denied a history of hospitalizations for psychiatric concerns, and has never met with a psychiatrist. She has never been prescribed psychotropic medications. She also denied a family history of mental health concerns. Furthermore, Annais denied a trauma history, including psychological, physical  and sexual abuse, as well as neglect.   Alli reported her typical mood is "good, very balanced." Aside from the concerns noted above and endorsed on the PHQ-9, Alexyia reported experiencing worry thoughts related to work. She further reported consuming alcohol in the form of 1-2 standard glasses of wine and described the frequency as varying. She noted, "A week may pass by before I drink or it can be every other day." She denied tobacco and illicit/recreational substance use. Kenisha reported she consumes 2 cups of coffee in the morning and 1-2 cups of tea in the afternoons. Moreover, Atalaya denied experiencing the following: hopelessness, memory concerns, obsessions and compulsions, hallucinations and delusions, paranoia, mania and decreased motivation. She also denied history of and current suicidal ideation, plan, and intent; history of and current homicidal ideation, plan, and intent; and history of and current engagement in self-harm.  The following strengths were reported by Maudry Mayhew: balanced person, hard worker, enjoys family, and people-person. She added, "I'm happy to be alive more than ever." The following strengths were observed by this provider: ability to express thoughts and feelings during the therapeutic session, ability to establish and benefit from a therapeutic relationship, ability to learn and practice  coping skills, willingness to work toward established goal(s) with the clinic and ability to engage in reciprocal conversation. Legal History: Ithzel denied a history of legal involvement.   Structured Assessment Results: The Patient Health Questionnaire-9 (PHQ-9) is a self-report measure that assesses symptoms and severity of depression over the course of the last two weeks. Rozlyn obtained a score of 4 suggesting minimal depression. Ameli finds the endorsed symptoms to be not difficult at all. Decreased interest 0  Down, depressed, hopeless 0  Altered sleeping 3  Tired, decreased energy 1  Change in appetite 0  Feeling bad or failure about yourself 0  Trouble concentrating 0  Moving slowly or fidgety/restless 0  Suicidal thoughts 0  PHQ-9 Score 4    The Generalized Anxiety Disorder-7 (GAD-7) is a brief self-report measure that assesses symptoms of anxiety over the course of the last two weeks. Kanasia obtained a score of 0. Nervous, anxious, on edge 0  Control/stop worrying 0  Worrying too much- different things 0  Trouble relaxing 0  Restless 0  Easily annoyed or irritable 0  Afraid-awful might happen 0  GAD-7 Score 0   Interventions: A chart review was conducted prior to the clinical intake interview. The PHQ-9, and GAD-7 were verbally administered as well as a Mood and Food questionnaire to assess various behaviors related to emotional eating. Throughout session, empathic reflections and validation was provided. Continuing treatment with this provider was discussed and a treatment goal was established. Psychoeducation regarding emotional versus physical hunger was provided. Jenasis was sent a handout via e-mail to utilize between now and the next appointment to increase awareness of hunger patterns and subsequent eating. Yamaris provided verbal consent during today's appointment for this provider to send the handout via e-mail.   Provisional DSM-5 Diagnosis: 311 (F32.8) Other Specified Depressive  Disorder, Emotional Eating Behaviors  Plan: Camyah appears able and willing to participate  as evidenced by collaboration on a treatment goal, engagement in reciprocal conversation, and asking questions as needed for clarification. The next appointment will be scheduled in two weeks, which will be via [employer]. The following treatment goal was established: decrease emotional eating. Once this provider's office resumes in-person appointments and it is deemed appropriate, Roslind will be notified. For the aforementioned goal, Mela can benefit from biweekly individual therapy sessions that are brief in duration. The treatment modality will be individual therapeutic services, including an eclectic therapeutic approach utilizing techniques from Cognitive Behavioral Therapy, Patient Centered Therapy, Dialectical Behavior Therapy, Acceptance and Commitment Therapy, Interpersonal Therapy, and Cognitive Restructuring. Therapeutic approach will include various interventions as appropriate, such as validation, support, mindfulness, thought defusion, reframing, psychoeducation, values assessment, and role playing. This provider will regularly review the treatment plan and medical chart to keep informed of status changes. Morgen expressed understanding and agreement with the initial treatment plan of care.

## 2018-09-06 ENCOUNTER — Ambulatory Visit (INDEPENDENT_AMBULATORY_CARE_PROVIDER_SITE_OTHER): Payer: 59 | Admitting: Psychology

## 2018-09-06 ENCOUNTER — Other Ambulatory Visit: Payer: Self-pay

## 2018-09-06 ENCOUNTER — Ambulatory Visit (INDEPENDENT_AMBULATORY_CARE_PROVIDER_SITE_OTHER): Payer: 59 | Admitting: Family Medicine

## 2018-09-06 DIAGNOSIS — F3289 Other specified depressive episodes: Secondary | ICD-10-CM | POA: Diagnosis not present

## 2018-09-12 ENCOUNTER — Ambulatory Visit (INDEPENDENT_AMBULATORY_CARE_PROVIDER_SITE_OTHER): Payer: 59 | Admitting: Family Medicine

## 2018-09-18 NOTE — Progress Notes (Unsigned)
Office: [phone]  /  Fax: [phone]    Date: September 20, 2018   Appointment Start Time:*** Duration:*** Provider: Glennie Isle, Psy.D. Type of Session: Individual Therapy  Location of Patient: *** Location of Provider: [facility] Type of Contact: Telepsychological Visit via [employer] Web[employer]   Session Content: Teresa Hubbard is a 58 y.o. female presenting via [place] for a follow-up appointment to address the previously established treatment goal of decreasing emotional eating. Today's appointment was a telepsychological visit, as this provider's clinic is seeing a limited number of patients for in-person visits due to COVID-19. Therapeutic services will resume to in-person appointments once deemed appropriate. Teresa Hubbard expressed understanding regarding the rationale for telepsychological services, and provided verbal consent for today's appointment. Prior to proceeding with today's appointment, Teresa Hubbard's physical location at the time of this appointment was obtained. Teresa Hubbard reported she was at *** and provided the address. In the event of technical difficulties, Teresa Hubbard shared a phone number she could be reached at. Teresa Hubbard and this provider participated in today's telepsychological service. Also, Teresa Hubbard denied anyone else being present in the room or on the WebEx appointment ***.  This provider conducted a brief check-in and verbally administered the PHQ-9 and GAD-7. ***   Teresa Hubbard was receptive to today's session as evidenced by openness to sharing, responsiveness to feedback, and ***.  Mental Status Examination:  Appearance: {Appearance:22431} Behavior: {Behavior:22445} Mood: {Teletherapy mood:22435} Affect: {Affect:22436} Speech: {Speech:22432} Eye Contact: {Eye Contact:22433} Psychomotor Activity: {Motor Activity:22434} Thought Process: {thought process:22448}  Content/Perceptual Disturbances: {disturbances:22451} Orientation: {Orientation:22437} Cognition/Sensorium: {gbcognition:22449}  Insight: {Insight:22446} Judgment: {Insight:22446}  Structured Assessment Results: The Patient Health Questionnaire-9 (PHQ-9) is a self-report measure that assesses symptoms and severity of depression over the course of the last two weeks. Teresa Hubbard obtained a score of *** suggesting {GBPHQ9SEVERITY:21752}. Teresa Hubbard finds the endorsed symptoms to be {gbphq9difficulty:21754}. Teresa Hubbard interest or pleasure in doing things ***  Feeling down, depressed, or hopeless ***  Trouble falling or staying asleep, or sleeping too much ***  Feeling tired or having Teresa Hubbard energy ***  Poor appetite or overeating ***  Feeling bad about yourself --- or that you are a failure or have let yourself or your family down ***  Trouble concentrating on things, such as reading the newspaper or watching television ***  Moving or speaking so slowly that other people could have noticed? Or the opposite --- being so fidgety or restless that you have been moving around a lot more than usual ***  Thoughts that you would be better off dead or hurting yourself in some way ***  PHQ-9 Score ***    The Generalized Anxiety Disorder-7 (GAD-7) is a brief self-report measure that assesses symptoms of anxiety over the course of the last two weeks. Teresa Hubbard obtained a score of *** suggesting {gbgad7severity:21753}. Teresa Hubbard finds the endorsed symptoms to be {gbphq9difficulty:21754}. Feeling nervous, anxious, on edge ***  Not being able to stop or control worrying ***  Worrying too much about different things ***  Trouble relaxing ***  Being so restless that it's hard to sit still ***  Becoming easily annoyed or irritable ***  Feeling afraid as if something awful might happen ***  GAD-7 Score ***   Interventions:  {Interventions:22172}  DSM-5 Diagnosis: 311 (F32.8) Other Specified Depressive Disorder, Emotional Eating Behaviors  Treatment Goal & Progress: During the initial appointment with this provider, the following treatment goal was established:  decrease emotional eating.Progress is limited, as Teresa Hubbard has just begun treatment with this provider; however, she is receptive to  the interaction and interventions and rapport is being established. Teresa Hubbard has demonstrated progress in her goal as evidenced by ***  Plan: Teresa Hubbard continues to appear able and willing to participate as evidenced by engagement in reciprocal conversation, and asking questions for clarification as appropriate. The next appointment will be scheduled in {gbweeks:21758}, which will be via [employer]. Once this provider's office resumes in-person appointments and it is deemed appropriate, Teresa Hubbard will be notified. The next session will focus on reviewing learned skills, and working towards the established treatment goal.***

## 2018-09-20 ENCOUNTER — Ambulatory Visit (INDEPENDENT_AMBULATORY_CARE_PROVIDER_SITE_OTHER): Payer: 59 | Admitting: Psychology

## 2018-09-25 ENCOUNTER — Ambulatory Visit (INDEPENDENT_AMBULATORY_CARE_PROVIDER_SITE_OTHER): Payer: 59

## 2018-09-25 DIAGNOSIS — Z23 Encounter for immunization: Secondary | ICD-10-CM | POA: Diagnosis not present

## 2018-09-25 DIAGNOSIS — Z299 Encounter for prophylactic measures, unspecified: Secondary | ICD-10-CM

## 2018-10-04 DIAGNOSIS — R638 Other symptoms and signs concerning food and fluid intake: Secondary | ICD-10-CM | POA: Insufficient documentation

## 2018-10-06 ENCOUNTER — Other Ambulatory Visit: Payer: Self-pay | Admitting: Obstetrics and Gynecology

## 2018-10-06 DIAGNOSIS — E2839 Other primary ovarian failure: Secondary | ICD-10-CM

## 2019-01-02 ENCOUNTER — Telehealth: Payer: Self-pay

## 2019-01-02 NOTE — Telephone Encounter (Signed)
Copied from [place] [phone]. Topic: General - Other >> Jan 02, 2019  2:33 PM Celene Kras A wrote: Reason for CRM: Pt called and is requesting to have a cholesterol medication sent in as her levels are still high. Please advise.  [id number] - [place], [place] - [address] [place] [address] Phone: [phone] Fax: [phone] Not a 24 hour pharmacy; exact hours not known.

## 2019-01-02 NOTE — Telephone Encounter (Signed)
Please advise  Would you like pt to be seen?

## 2019-01-04 NOTE — Telephone Encounter (Signed)
LMTCB

## 2019-01-04 NOTE — Telephone Encounter (Signed)
Pt notified and states she has already started taking her old prescription of Lipitor

## 2019-01-04 NOTE — Telephone Encounter (Signed)
Her coronary calcium was 0 - she is ok.  Pls order lipids - see me w/the results Thx

## 2019-01-31 ENCOUNTER — Other Ambulatory Visit: Payer: Self-pay

## 2019-01-31 ENCOUNTER — Ambulatory Visit (INDEPENDENT_AMBULATORY_CARE_PROVIDER_SITE_OTHER): Payer: 59 | Admitting: Internal Medicine

## 2019-01-31 ENCOUNTER — Encounter: Payer: Self-pay | Admitting: Internal Medicine

## 2019-01-31 ENCOUNTER — Other Ambulatory Visit (INDEPENDENT_AMBULATORY_CARE_PROVIDER_SITE_OTHER): Payer: 59

## 2019-01-31 VITALS — BP 126/70 | HR 75 | Temp 98.5°F | Ht 67.0 in | Wt 208.0 lb

## 2019-01-31 DIAGNOSIS — R0989 Other specified symptoms and signs involving the circulatory and respiratory systems: Secondary | ICD-10-CM

## 2019-01-31 DIAGNOSIS — L219 Seborrheic dermatitis, unspecified: Secondary | ICD-10-CM

## 2019-01-31 DIAGNOSIS — Z0000 Encounter for general adult medical examination without abnormal findings: Secondary | ICD-10-CM

## 2019-01-31 DIAGNOSIS — E538 Deficiency of other specified B group vitamins: Secondary | ICD-10-CM | POA: Diagnosis not present

## 2019-01-31 DIAGNOSIS — K5730 Diverticulosis of large intestine without perforation or abscess without bleeding: Secondary | ICD-10-CM

## 2019-01-31 DIAGNOSIS — E559 Vitamin D deficiency, unspecified: Secondary | ICD-10-CM

## 2019-01-31 DIAGNOSIS — Z23 Encounter for immunization: Secondary | ICD-10-CM

## 2019-01-31 DIAGNOSIS — E785 Hyperlipidemia, unspecified: Secondary | ICD-10-CM

## 2019-01-31 LAB — CBC WITH DIFFERENTIAL/PLATELET
Basophils Absolute: 0.1 10*3/uL (ref 0.0–0.1)
Basophils Relative: 2.4 % (ref 0.0–3.0)
Eosinophils Absolute: 0.2 10*3/uL (ref 0.0–0.7)
Eosinophils Relative: 5.1 % — ABNORMAL HIGH (ref 0.0–5.0)
HCT: 43.8 % (ref 36.0–46.0)
Hemoglobin: 14.9 g/dL (ref 12.0–15.0)
Lymphocytes Relative: 37.4 % (ref 12.0–46.0)
Lymphs Abs: 1.7 10*3/uL (ref 0.7–4.0)
MCHC: 34 g/dL (ref 30.0–36.0)
MCV: 89.4 fl (ref 78.0–100.0)
Monocytes Absolute: 0.3 10*3/uL (ref 0.1–1.0)
Monocytes Relative: 6.9 % (ref 3.0–12.0)
Neutro Abs: 2.2 10*3/uL (ref 1.4–7.7)
Neutrophils Relative %: 48.2 % (ref 43.0–77.0)
Platelets: 264 10*3/uL (ref 150.0–400.0)
RBC: 4.9 Mil/uL (ref 3.87–5.11)
RDW: 13.2 % (ref 11.5–15.5)
WBC: 4.6 10*3/uL (ref 4.0–10.5)

## 2019-01-31 LAB — HEPATIC FUNCTION PANEL
ALT: 41 U/L — ABNORMAL HIGH (ref 0–35)
AST: 25 U/L (ref 0–37)
Albumin: 4.5 g/dL (ref 3.5–5.2)
Alkaline Phosphatase: 49 U/L (ref 39–117)
Bilirubin, Direct: 0.1 mg/dL (ref 0.0–0.3)
Total Bilirubin: 0.4 mg/dL (ref 0.2–1.2)
Total Protein: 7.6 g/dL (ref 6.0–8.3)

## 2019-01-31 LAB — BASIC METABOLIC PANEL
BUN: 14 mg/dL (ref 6–23)
CO2: 27 mEq/L (ref 19–32)
Calcium: 9.5 mg/dL (ref 8.4–10.5)
Chloride: 102 mEq/L (ref 96–112)
Creatinine, Ser: 0.92 mg/dL (ref 0.40–1.20)
GFR: 62.79 mL/min (ref >60.00)
Glucose, Bld: 98 mg/dL (ref 70–99)
Potassium: 4.2 mEq/L (ref 3.5–5.1)
Sodium: 138 mEq/L (ref 135–145)

## 2019-01-31 LAB — URINALYSIS
Bilirubin Urine: NEGATIVE
Hgb urine dipstick: NEGATIVE
Ketones, ur: NEGATIVE
Leukocytes,Ua: NEGATIVE
Nitrite: NEGATIVE
Specific Gravity, Urine: 1.01 (ref 1.000–1.030)
Total Protein, Urine: NEGATIVE
Urine Glucose: NEGATIVE
Urobilinogen, UA: 0.2 (ref 0.0–1.0)
pH: 7 (ref 5.0–8.0)

## 2019-01-31 LAB — LIPID PANEL
Cholesterol: 241 mg/dL — ABNORMAL HIGH (ref 0–200)
HDL: 43.8 mg/dL (ref >39.00)
NonHDL: 197.63
Total CHOL/HDL Ratio: 6
Triglycerides: 250 mg/dL — ABNORMAL HIGH (ref 0.0–149.0)
VLDL: 50 mg/dL — ABNORMAL HIGH (ref 0.0–40.0)

## 2019-01-31 LAB — LDL CHOLESTEROL, DIRECT: Direct LDL: 167 mg/dL

## 2019-01-31 LAB — TSH: TSH: 2.4 u[IU]/mL (ref 0.35–4.50)

## 2019-01-31 MED ORDER — CLOTRIMAZOLE-BETAMETHASONE 1-0.05 % EX CREA: 1.0000 "application " | TOPICAL_CREAM | Freq: Two times a day (BID) | CUTANEOUS | 3 refills | Status: DC

## 2019-01-31 NOTE — Patient Instructions (Signed)

## 2019-01-31 NOTE — Assessment & Plan Note (Addendum)
Duplex US B

## 2019-01-31 NOTE — Assessment & Plan Note (Signed)
On B12 

## 2019-01-31 NOTE — Assessment & Plan Note (Addendum)
D/c Atorvastatin Cardiac CT scan for coronary calcium is 0

## 2019-01-31 NOTE — Assessment & Plan Note (Signed)
Stable

## 2019-01-31 NOTE — Assessment & Plan Note (Signed)
We discussed age appropriate health related issues, including available/recomended screening tests and vaccinations. We discussed a need for adhering to healthy diet and exercise. Labs were ordered to be later reviewed . All questions were answered. Flu shot 2020 Coronary calcium score 0

## 2019-01-31 NOTE — Assessment & Plan Note (Signed)
Vit D 

## 2019-01-31 NOTE — Progress Notes (Addendum)
Subjective:  Patient ID: Teresa Hubbard, female    DOB: [birth year]  Age: 58 y.o. MRN: [id number]  CC: No chief complaint on file.   HPI RENYA KASLER presents for a well exam F/u dyslipidemia. C/o arthralgias. C/o nose bridge rash x weeks  Outpatient Medications Prior to Visit  Medication Sig Dispense Refill  . Cyanocobalamin (VITAMIN B-12) 1000 MCG SUBL Place 1 tablet (1,000 mcg total) under the tongue daily. 100 tablet 3   No facility-administered medications prior to visit.     ROS: Review of Systems  Constitutional: Positive for unexpected weight change. Negative for activity change, appetite change, chills and fatigue.  HENT: Negative for congestion, mouth sores and sinus pressure.   Eyes: Negative for visual disturbance.  Respiratory: Negative for cough and chest tightness.   Gastrointestinal: Negative for abdominal pain and nausea.  Genitourinary: Negative for difficulty urinating, frequency and vaginal pain.  Musculoskeletal: Positive for arthralgias, back pain, neck pain and neck stiffness. Negative for gait problem.  Skin: Positive for rash. Negative for pallor.  Neurological: Negative for dizziness, tremors, weakness, numbness and headaches.  Psychiatric/Behavioral: Negative for confusion, sleep disturbance and suicidal ideas.    Objective:  BP 126/70 (BP Location: Left Arm, Patient Position: Sitting, Cuff Size: Large)   Pulse 75   Temp 98.5 F (36.9 C) (Oral)   Ht 5\' 7"  (1.702 m)   Wt 208 lb (94.3 kg)   SpO2 98%   BMI 32.58 kg/m   BP Readings from Last 3 Encounters:  01/31/19 126/70  08/23/18 138/79  04/07/18 118/76    Wt Readings from Last 3 Encounters:  01/31/19 208 lb (94.3 kg)  08/23/18 201 lb (91.2 kg)  04/07/18 203 lb (92.1 kg)    Physical Exam Constitutional:      General: She is not in acute distress.    Appearance: She is well-developed. She is obese.  HENT:     Head: Normocephalic.     Right Ear: External ear normal.     Left Ear:  External ear normal.     Nose: Nose normal.  Eyes:     General:        Right eye: No discharge.        Left eye: No discharge.     Conjunctiva/sclera: Conjunctivae normal.     Pupils: Pupils are equal, round, and reactive to light.  Neck:     Musculoskeletal: Normal range of motion and neck supple.     Thyroid: No thyromegaly.     Vascular: No JVD.     Trachea: No tracheal deviation.  Cardiovascular:     Rate and Rhythm: Normal rate and regular rhythm.     Heart sounds: Normal heart sounds.  Pulmonary:     Effort: No respiratory distress.     Breath sounds: No stridor. No wheezing.  Abdominal:     General: Bowel sounds are normal. There is no distension.     Palpations: Abdomen is soft. There is no mass.     Tenderness: There is no abdominal tenderness. There is no guarding or rebound.  Musculoskeletal:        General: No tenderness.  Lymphadenopathy:     Cervical: No cervical adenopathy.  Skin:    Findings: Rash present. No erythema.  Neurological:     Mental Status: She is oriented to person, place, and time.     Cranial Nerves: No cranial nerve deficit.     Motor: No abnormal muscle tone.  Coordination: Coordination normal.     Deep Tendon Reflexes: Reflexes normal.  Psychiatric:        Behavior: Behavior normal.        Thought Content: Thought content normal.        Judgment: Judgment normal.    Rash on nose bridge -- ?SD Mild bruit B  Lab Results  Component Value Date   WBC 3.7 08/23/2018   HGB 14.8 08/23/2018   HCT 42.8 08/23/2018   PLT 251.0 04/03/2018   GLUCOSE 92 08/23/2018   CHOL 333 (H) 08/23/2018   TRIG 168 (H) 08/23/2018   HDL 51 08/23/2018   LDLDIRECT 203.0 04/03/2018   LDLCALC 248 (H) 08/23/2018   ALT 29 08/23/2018   AST 17 08/23/2018   NA 142 08/23/2018   K 4.8 08/23/2018   CL 101 08/23/2018   CREATININE 0.89 08/23/2018   BUN 11 08/23/2018   CO2 24 08/23/2018   TSH 3.460 08/23/2018   PSA 0.00 (L) 01/22/2011   HGBA1C 5.4 08/23/2018     Mr Jodene Nam Head Wo Contrast  Result Date: 09/03/2018 CLINICAL DATA:  Constant headaches, RIGHT-sided for 6 months. EXAM: MRA HEAD WITHOUT CONTRAST TECHNIQUE: Angiographic images of the Circle of Willis were obtained using MRA technique without intravenous contrast. COMPARISON:  None. FINDINGS: The internal carotid arteries are widely patent. Unremarkable anterior and middle cerebral artery segments. Basilar artery widely patent with vertebrals codominant. Both posterior cerebral arteries originate from the basilar tip. There is no intracranial stenosis or saccular aneurysm. No visible vascular dissection. No congenital anomaly or branch occlusion. No vascular malformation. IMPRESSION: Negative exam. Unremarkable and normal intracranial circulation. Specifically, no saccular aneurysm or dissection. No structural vascular cause for headaches is observed. Electronically Signed   By: Staci Righter M.D.   On: 09/03/2018 09:13    Assessment & Plan:   There are no diagnoses linked to this encounter.   No orders of the defined types were placed in this encounter.    Follow-up: No follow-ups on file.  Walker Kehr, MD

## 2019-01-31 NOTE — Assessment & Plan Note (Signed)
Lotrisone bid  

## 2019-02-01 NOTE — Addendum Note (Signed)
Addended by: Karren Cobble on: 02/01/2019 08:11 AM   Modules accepted: Orders

## 2019-02-01 NOTE — Addendum Note (Signed)
Addended by: Karren Cobble on: 02/01/2019 03:35 PM   Modules accepted: Orders

## 2019-02-05 ENCOUNTER — Other Ambulatory Visit: Payer: Self-pay

## 2019-02-05 ENCOUNTER — Ambulatory Visit (HOSPITAL_COMMUNITY)
Admission: RE | Admit: 2019-02-05 | Discharge: 2019-02-05 | Disposition: A | Discharge status: Home / Self Care | Payer: 59 | Source: Ambulatory Visit | Attending: Cardiology | Admitting: Cardiology

## 2019-02-05 DIAGNOSIS — R0989 Other specified symptoms and signs involving the circulatory and respiratory systems: Secondary | ICD-10-CM

## 2019-12-12 ENCOUNTER — Encounter: Payer: 59 | Admitting: Internal Medicine

## 2019-12-31 ENCOUNTER — Encounter: Payer: Self-pay | Admitting: Internal Medicine

## 2019-12-31 ENCOUNTER — Other Ambulatory Visit: Payer: Self-pay

## 2019-12-31 ENCOUNTER — Ambulatory Visit (INDEPENDENT_AMBULATORY_CARE_PROVIDER_SITE_OTHER): Payer: 59 | Admitting: Internal Medicine

## 2019-12-31 VITALS — BP 124/88 | HR 71 | Temp 98.5°F | Ht 67.0 in | Wt 213.0 lb

## 2019-12-31 DIAGNOSIS — G43009 Migraine without aura, not intractable, without status migrainosus: Secondary | ICD-10-CM

## 2019-12-31 DIAGNOSIS — E538 Deficiency of other specified B group vitamins: Secondary | ICD-10-CM

## 2019-12-31 DIAGNOSIS — K5730 Diverticulosis of large intestine without perforation or abscess without bleeding: Secondary | ICD-10-CM | POA: Diagnosis not present

## 2019-12-31 DIAGNOSIS — Z0000 Encounter for general adult medical examination without abnormal findings: Secondary | ICD-10-CM

## 2019-12-31 DIAGNOSIS — R232 Flushing: Secondary | ICD-10-CM

## 2019-12-31 DIAGNOSIS — E66811 Obesity, class 1: Secondary | ICD-10-CM

## 2019-12-31 DIAGNOSIS — M542 Cervicalgia: Secondary | ICD-10-CM

## 2019-12-31 DIAGNOSIS — E6609 Other obesity due to excess calories: Secondary | ICD-10-CM

## 2019-12-31 DIAGNOSIS — E559 Vitamin D deficiency, unspecified: Secondary | ICD-10-CM

## 2019-12-31 MED ORDER — VITAMIN D3 50 MCG (2000 UT) PO CAPS: 2000.0000 [IU] | ORAL_CAPSULE | Freq: Every day | ORAL | 3 refills | Status: AC

## 2019-12-31 MED ORDER — CLOTRIMAZOLE-BETAMETHASONE 1-0.05 % EX CREA: 1.0000 "application " | TOPICAL_CREAM | Freq: Two times a day (BID) | CUTANEOUS | 3 refills | Status: DC

## 2019-12-31 MED ORDER — METRONIDAZOLE 0.75 % EX CREA: TOPICAL_CREAM | Freq: Two times a day (BID) | CUTANEOUS | 2 refills | Status: DC

## 2019-12-31 NOTE — Assessment & Plan Note (Signed)
On B12 

## 2019-12-31 NOTE — Assessment & Plan Note (Signed)
Yoga

## 2019-12-31 NOTE — Assessment & Plan Note (Signed)
On Vit D 

## 2019-12-31 NOTE — Progress Notes (Signed)
Subjective:  Patient ID: Teresa Hubbard, female    DOB: April 29, 1960  Age: 59 y.o. MRN: [id number]  CC: No chief complaint on file.   HPI Teresa Hubbard presents for a well exam. Working 60 h/wk C/o HAs - tension, posterior neck pain C/o rosacea, hemorrhoids  Outpatient Medications Prior to Visit  Medication Sig Dispense Refill   clotrimazole-betamethasone (LOTRISONE) cream Apply 1 application topically 2 (two) times daily. 30 g 3   Cyanocobalamin (VITAMIN B-12) 1000 MCG SUBL Place 1 tablet (1,000 mcg total) under the tongue daily. 100 tablet 3   No facility-administered medications prior to visit.    ROS: Review of Systems  Constitutional: Positive for unexpected weight change. Negative for activity change, appetite change, chills and fatigue.  HENT: Negative for congestion, mouth sores and sinus pressure.   Eyes: Negative for visual disturbance.  Respiratory: Negative for cough and chest tightness.   Gastrointestinal: Negative for abdominal pain and nausea.  Genitourinary: Negative for difficulty urinating, frequency and vaginal pain.  Musculoskeletal: Positive for arthralgias, neck pain and neck stiffness. Negative for back pain and gait problem.  Skin: Negative for pallor and rash.  Neurological: Positive for headaches. Negative for dizziness, tremors, weakness and numbness.  Psychiatric/Behavioral: Negative for confusion, sleep disturbance and suicidal ideas.    Objective:  BP 124/88 (BP Location: Right Arm, Patient Position: Sitting, Cuff Size: Large)    Pulse 71    Temp 98.5 F (36.9 C) (Oral)    Ht 5\' 7"  (1.702 m)    Wt 213 lb (96.6 kg)    SpO2 96%    BMI 33.36 kg/m   BP Readings from Last 3 Encounters:  12/31/19 124/88  01/31/19 126/70  08/23/18 138/79    Wt Readings from Last 3 Encounters:  12/31/19 213 lb (96.6 kg)  01/31/19 208 lb (94.3 kg)  08/23/18 201 lb (91.2 kg)    Physical Exam Constitutional:      General: She is not in acute distress.     Appearance: She is well-developed. She is obese.  HENT:     Head: Normocephalic.     Right Ear: External ear normal.     Left Ear: External ear normal.     Nose: Nose normal.  Eyes:     General:        Right eye: No discharge.        Left eye: No discharge.     Conjunctiva/sclera: Conjunctivae normal.     Pupils: Pupils are equal, round, and reactive to light.  Neck:     Thyroid: No thyromegaly.     Vascular: No JVD.     Trachea: No tracheal deviation.  Cardiovascular:     Rate and Rhythm: Normal rate and regular rhythm.     Heart sounds: Normal heart sounds.  Pulmonary:     Effort: No respiratory distress.     Breath sounds: No stridor. No wheezing.  Abdominal:     General: Bowel sounds are normal. There is no distension.     Palpations: Abdomen is soft. There is no mass.     Tenderness: There is no abdominal tenderness. There is no guarding or rebound.  Musculoskeletal:        General: Tenderness present.     Cervical back: Normal range of motion and neck supple.  Lymphadenopathy:     Cervical: No cervical adenopathy.  Skin:    Findings: No erythema or rash.  Neurological:     Mental Status: She is oriented to  person, place, and time.     Cranial Nerves: No cranial nerve deficit.     Motor: No abnormal muscle tone.     Coordination: Coordination normal.     Deep Tendon Reflexes: Reflexes normal.  Psychiatric:        Behavior: Behavior normal.        Thought Content: Thought content normal.        Judgment: Judgment normal.    Painful to palpation traps and paraspinal cervical muscles bilaterally. Mild rosacea   I spent 22 minutes in addition to time for CPX wellness examination in preparing to see the patient by review of recent labs, imaging and procedures, obtaining and reviewing separately obtained history, communicating with the patient, ordering medications, tests or procedures, and documenting clinical information in the EHR including the differential  diagnosis, treatment, and any further evaluation and other management of  cervical pain, rosacea, B12 deficiency     Assessment & Plan Note by     Lab Results  Component Value Date   WBC 4.6 01/31/2019   HGB 14.9 01/31/2019   HCT 43.8 01/31/2019   PLT 264.0 01/31/2019   GLUCOSE 98 01/31/2019   CHOL 241 (H) 01/31/2019   TRIG 250.0 (H) 01/31/2019   HDL 43.80 01/31/2019   LDLDIRECT 167.0 01/31/2019   LDLCALC 248 (H) 08/23/2018   ALT 41 (H) 01/31/2019   AST 25 01/31/2019   NA 138 01/31/2019   K 4.2 01/31/2019   CL 102 01/31/2019   CREATININE 0.92 01/31/2019   BUN 14 01/31/2019   CO2 27 01/31/2019   TSH 2.40 01/31/2019   PSA 0.00 (L) 01/22/2011   HGBA1C 5.4 08/23/2018    VAS US CAROTID  Result Date: 02/06/2019 Carotid Arterial Duplex Study Indications:  Bilateral bruits. No cerebrovascular symptoms. Risk Factors: No history of smoking. Performing Technologist: Mariane Masters RVT  Examination Guidelines: A complete evaluation includes B-mode imaging, spectral Doppler, color Doppler, and power Doppler as needed of all accessible portions of each vessel. Bilateral testing is considered an integral part of a complete examination. Limited examinations for reoccurring indications may be performed as noted.  Right Carotid Findings: +----------+--------+--------+--------+------------------+------------------+             PSV cm/s EDV cm/s Stenosis Plaque Description Comments            +----------+--------+--------+--------+------------------+------------------+  CCA Prox   58       13                                                       +----------+--------+--------+--------+------------------+------------------+  CCA Distal 90       23                                   intimal thickening  +----------+--------+--------+--------+------------------+------------------+  ICA Prox   74       19                                                        +----------+--------+--------+--------+------------------+------------------+  ICA Mid    62  25                                                       +----------+--------+--------+--------+------------------+------------------+  ICA Distal 85       38                                                       +----------+--------+--------+--------+------------------+------------------+  ECA        109      18                                                       +----------+--------+--------+--------+------------------+------------------+ +----------+--------+-------+----------------+-------------------+             PSV cm/s EDV cms Describe         Arm Pressure (mmHG)  +----------+--------+-------+----------------+-------------------+  Subclavian 191              Multiphasic, WNL 120                  +----------+--------+-------+----------------+-------------------+ +---------+--------+--+--------+--+---------+  Vertebral PSV cm/s 44 EDV cm/s 14 Antegrade  +---------+--------+--+--------+--+---------+  Left Carotid Findings: +----------+--------+--------+--------+------------------+------------------+             PSV cm/s EDV cm/s Stenosis Plaque Description Comments            +----------+--------+--------+--------+------------------+------------------+  CCA Prox   117      30                                                       +----------+--------+--------+--------+------------------+------------------+  CCA Distal 99       26                                   intimal thickening  +----------+--------+--------+--------+------------------+------------------+  ICA Prox   72       20                                                       +----------+--------+--------+--------+------------------+------------------+  ICA Mid    40       16                                   tortuous            +----------+--------+--------+--------+------------------+------------------+  ICA Distal 48       19                                                        +----------+--------+--------+--------+------------------+------------------+  ECA        110      30                                                       +----------+--------+--------+--------+------------------+------------------+ +----------+--------+--------+----------------+-------------------+             PSV cm/s EDV cm/s Describe         Arm Pressure (mmHG)  +----------+--------+--------+----------------+-------------------+  Subclavian 103               Multiphasic, WNL 120                  +----------+--------+--------+----------------+-------------------+ +---------+--------+--+--------+--+---------+  Vertebral PSV cm/s 41 EDV cm/s 14 Antegrade  +---------+--------+--+--------+--+---------+ The mid/distal ICA takes a deep dive. Summary: Right Carotid: There is no evidence of stenosis in the right ICA. The                extracranial vessels were near-normal with only minimal wall                thickening or plaque. Left Carotid: There is no evidence of stenosis in the left ICA. The extracranial               vessels were near-normal with only minimal wall thickening or               plaque. Vertebrals:  Bilateral vertebral arteries demonstrate antegrade flow. Subclavians: Normal flow hemodynamics were seen in bilateral subclavian              arteries. *See table(s) above for measurements and observations.  Electronically signed by Carlyle Dolly MD on 02/06/2019 at 11:40:01 AM.    Final     Assessment & Plan:    Walker Kehr, MD

## 2019-12-31 NOTE — Patient Instructions (Signed)
You can try [place] Mushroom extract or capsules for memory Yoga Massage

## 2020-01-01 LAB — URINALYSIS
Bilirubin Urine: NEGATIVE
Hgb urine dipstick: NEGATIVE
Ketones, ur: NEGATIVE
Leukocytes,Ua: NEGATIVE
Nitrite: NEGATIVE
Specific Gravity, Urine: 1.01 (ref 1.000–1.030)
Total Protein, Urine: NEGATIVE
Urine Glucose: NEGATIVE
Urobilinogen, UA: 0.2 (ref 0.0–1.0)
pH: 6 (ref 5.0–8.0)

## 2020-01-01 LAB — CBC WITH DIFFERENTIAL/PLATELET
Basophils Absolute: 0 10*3/uL (ref 0.0–0.1)
Basophils Relative: 0.5 % (ref 0.0–3.0)
Eosinophils Absolute: 0.1 10*3/uL (ref 0.0–0.7)
Eosinophils Relative: 2.8 % (ref 0.0–5.0)
HCT: 43 % (ref 36.0–46.0)
Hemoglobin: 14.5 g/dL (ref 12.0–15.0)
Lymphocytes Relative: 40.9 % (ref 12.0–46.0)
Lymphs Abs: 2 10*3/uL (ref 0.7–4.0)
MCHC: 33.8 g/dL (ref 30.0–36.0)
MCV: 90.1 fl (ref 78.0–100.0)
Monocytes Absolute: 0.4 10*3/uL (ref 0.1–1.0)
Monocytes Relative: 7.6 % (ref 3.0–12.0)
Neutro Abs: 2.4 10*3/uL (ref 1.4–7.7)
Neutrophils Relative %: 48.2 % (ref 43.0–77.0)
Platelets: 251 10*3/uL (ref 150.0–400.0)
RBC: 4.77 Mil/uL (ref 3.87–5.11)
RDW: 13 % (ref 11.5–15.5)
WBC: 4.9 10*3/uL (ref 4.0–10.5)

## 2020-01-01 LAB — LIPID PANEL
Cholesterol: 335 mg/dL — ABNORMAL HIGH (ref 0–200)
HDL: 48.3 mg/dL (ref >39.00)
NonHDL: 286.79
Total CHOL/HDL Ratio: 7
Triglycerides: 266 mg/dL — ABNORMAL HIGH (ref 0.0–149.0)
VLDL: 53.2 mg/dL — ABNORMAL HIGH (ref 0.0–40.0)

## 2020-01-01 LAB — COMPREHENSIVE METABOLIC PANEL
ALT: 31 U/L (ref 0–35)
AST: 16 U/L (ref 0–37)
Albumin: 4.5 g/dL (ref 3.5–5.2)
Alkaline Phosphatase: 39 U/L (ref 39–117)
BUN: 14 mg/dL (ref 6–23)
CO2: 28 mEq/L (ref 19–32)
Calcium: 9.7 mg/dL (ref 8.4–10.5)
Chloride: 103 mEq/L (ref 96–112)
Creatinine, Ser: 0.95 mg/dL (ref 0.40–1.20)
GFR: 65.78 mL/min (ref >60.00)
Glucose, Bld: 90 mg/dL (ref 70–99)
Potassium: 4.5 mEq/L (ref 3.5–5.1)
Sodium: 140 mEq/L (ref 135–145)
Total Bilirubin: 0.5 mg/dL (ref 0.2–1.2)
Total Protein: 7.4 g/dL (ref 6.0–8.3)

## 2020-01-01 LAB — LDL CHOLESTEROL, DIRECT: Direct LDL: 243 mg/dL

## 2020-01-01 LAB — VITAMIN D 25 HYDROXY (VIT D DEFICIENCY, FRACTURES): VITD: 35.68 ng/mL (ref 30.00–100.00)

## 2020-01-01 LAB — VITAMIN B12: Vitamin B-12: 1075 pg/mL — ABNORMAL HIGH (ref 211–911)

## 2020-01-01 LAB — TSH: TSH: 2.99 u[IU]/mL (ref 0.35–4.50)

## 2020-01-04 DIAGNOSIS — M542 Cervicalgia: Secondary | ICD-10-CM | POA: Insufficient documentation

## 2020-01-04 NOTE — Assessment & Plan Note (Signed)
Wt Readings from Last 3 Encounters:  12/31/19 213 lb (96.6 kg)  01/31/19 208 lb (94.3 kg)  08/23/18 201 lb (91.2 kg)  On diet

## 2020-01-04 NOTE — Assessment & Plan Note (Signed)
Resolved

## 2020-01-04 NOTE — Assessment & Plan Note (Signed)
We discussed age appropriate health related issues, including available/recomended screening tests and vaccinations. We discussed a need for adhering to healthy diet and exercise. Labs were ordered to be later reviewed . All questions were answered. 2020 Coronary calcium score 0 Regular GYN, dental care.  Regular eye exams

## 2020-01-04 NOTE — Assessment & Plan Note (Signed)
Musculoskeletal due to working long hours Stretching, heat, massage, and dynamic workstation NSAIDs as needed

## 2020-01-04 NOTE — Assessment & Plan Note (Signed)
No relapse of diverticulitis

## 2020-06-24 ENCOUNTER — Telehealth (INDEPENDENT_AMBULATORY_CARE_PROVIDER_SITE_OTHER): Payer: 59 | Admitting: Internal Medicine

## 2020-06-24 DIAGNOSIS — R197 Diarrhea, unspecified: Secondary | ICD-10-CM | POA: Diagnosis not present

## 2020-06-29 NOTE — Progress Notes (Incomplete)
Subjective:  Patient ID: Teresa Hubbard, female    DOB: 1960/11/09  Age: 60 y.o. MRN: [id number]  CC: No chief complaint on file.   HPI Teresa Hubbard presents for diarrhea.  It started a few weeks ago in [place].  It watery stools 2-3 times a day.  No blood or mucus.  No fever.  No weight loss no nausea vomiting  Outpatient Medications Prior to Visit  Medication Sig Dispense Refill  . Cholecalciferol (VITAMIN D3) 50 MCG (2000 UT) capsule Take 1 capsule (2,000 Units total) by mouth daily. 100 capsule 3  . clotrimazole-betamethasone (LOTRISONE) cream Apply 1 application topically 2 (two) times daily. 45 g 3  . Cyanocobalamin (VITAMIN B-12) 1000 MCG SUBL Place 1 tablet (1,000 mcg total) under the tongue daily. 100 tablet 3  . metroNIDAZOLE (METROCREAM) 0.75 % cream Apply topically 2 (two) times daily. 45 g 2   No facility-administered medications prior to visit.    ROS: Review of Systems  Objective:  There were no vitals taken for this visit.  BP Readings from Last 3 Encounters:  12/31/19 124/88  01/31/19 126/70  08/23/18 138/79    Wt Readings from Last 3 Encounters:  12/31/19 213 lb (96.6 kg)  01/31/19 208 lb (94.3 kg)  08/23/18 201 lb (91.2 kg)    Physical Exam  Lab Results  Component Value Date   WBC 4.9 12/31/2019   HGB 14.5 12/31/2019   HCT 43.0 12/31/2019   PLT 251.0 12/31/2019   GLUCOSE 90 12/31/2019   CHOL 335 (H) 12/31/2019   TRIG 266.0 (H) 12/31/2019   HDL 48.30 12/31/2019   LDLDIRECT 243.0 12/31/2019   LDLCALC 248 (H) 08/23/2018   ALT 31 12/31/2019   AST 16 12/31/2019   NA 140 12/31/2019   K 4.5 12/31/2019   CL 103 12/31/2019   CREATININE 0.95 12/31/2019   BUN 14 12/31/2019   CO2 28 12/31/2019   TSH 2.99 12/31/2019   PSA 0.00 (L) 01/22/2011   HGBA1C 5.4 08/23/2018    VAS US CAROTID  Result Date: 02/06/2019 Carotid Arterial Duplex Study Indications:  Bilateral bruits. No cerebrovascular symptoms. Risk Factors: No history of smoking.  Performing Technologist: Mariane Masters RVT  Examination Guidelines: A complete evaluation includes B-mode imaging, spectral Doppler, color Doppler, and power Doppler as needed of all accessible portions of each vessel. Bilateral testing is considered an integral part of a complete examination. Limited examinations for reoccurring indications may be performed as noted.  Right Carotid Findings: +----------+--------+--------+--------+------------------+------------------+           PSV cm/sEDV cm/sStenosisPlaque DescriptionComments           +----------+--------+--------+--------+------------------+------------------+ CCA Prox  58      13                                                   +----------+--------+--------+--------+------------------+------------------+ CCA Distal90      23                                intimal thickening +----------+--------+--------+--------+------------------+------------------+ ICA Prox  74      19                                                   +----------+--------+--------+--------+------------------+------------------+  ICA Mid   62      25                                                   +----------+--------+--------+--------+------------------+------------------+ ICA Distal85      38                                                   +----------+--------+--------+--------+------------------+------------------+ ECA       109     18                                                   +----------+--------+--------+--------+------------------+------------------+ +----------+--------+-------+----------------+-------------------+           PSV cm/sEDV cmsDescribe        Arm Pressure (mmHG) +----------+--------+-------+----------------+-------------------+ [id number]            Multiphasic, [id number]                 +----------+--------+-------+----------------+-------------------+ +---------+--------+--+--------+--+---------+  VertebralPSV cm/s44EDV cm/s14Antegrade +---------+--------+--+--------+--+---------+  Left Carotid Findings: +----------+--------+--------+--------+------------------+------------------+           PSV cm/sEDV cm/sStenosisPlaque DescriptionComments           +----------+--------+--------+--------+------------------+------------------+ CCA Prox  117     30                                                   +----------+--------+--------+--------+------------------+------------------+ CCA Distal99      26                                intimal thickening +----------+--------+--------+--------+------------------+------------------+ ICA Prox  72      20                                                   +----------+--------+--------+--------+------------------+------------------+ ICA Mid   40      16                                tortuous           +----------+--------+--------+--------+------------------+------------------+ ICA Distal48      19                                                   +----------+--------+--------+--------+------------------+------------------+ ECA       110     30                                                   +----------+--------+--------+--------+------------------+------------------+ +----------+--------+--------+----------------+-------------------+  PSV cm/sEDV cm/sDescribe        Arm Pressure (mmHG) +----------+--------+--------+----------------+-------------------+ [id number]             Multiphasic, [id number]                 +----------+--------+--------+----------------+-------------------+ +---------+--------+--+--------+--+---------+ VertebralPSV cm/s41EDV cm/s14Antegrade +---------+--------+--+--------+--+---------+ The mid/distal ICA takes a deep dive. Summary: Right Carotid: There is no evidence of stenosis in the right ICA. The                extracranial vessels were near-normal with only  minimal wall                thickening or plaque. Left Carotid: There is no evidence of stenosis in the left ICA. The extracranial               vessels were near-normal with only minimal wall thickening or               plaque. Vertebrals:  Bilateral vertebral arteries demonstrate antegrade flow. Subclavians: Normal flow hemodynamics were seen in bilateral subclavian              arteries. *See table(s) above for measurements and observations.  Electronically signed by Carlyle Dolly MD on 02/06/2019 at 11:40:01 AM.    Final     Assessment & Plan:   Diagnoses and all orders for this visit:  Diarrhea of presumed infectious origin -     Giardia/cryptosporidium (EIA); Future -     Clostridium difficile EIA; Future -     Fecal lactoferrin, quant; Future     No orders of the defined types were placed in this encounter.    Follow-up: No follow-ups on file.  Walker Kehr, MD

## 2020-06-30 ENCOUNTER — Other Ambulatory Visit: Payer: 59

## 2020-06-30 DIAGNOSIS — R197 Diarrhea, unspecified: Secondary | ICD-10-CM

## 2020-07-01 LAB — COLOGUARD: Cologuard: NEGATIVE

## 2020-07-02 LAB — CLOSTRIDIUM DIFFICILE EIA: C difficile Toxins A+B, EIA: NEGATIVE

## 2020-07-03 LAB — GIARDIA AND CRYPTOSPORIDIUM ANTIGEN PANEL: Result:: NOT DETECTED

## 2020-07-03 LAB — FECAL LACTOFERRIN, QUANT: LACTOFERRIN, QL, STOOL: NEGATIVE

## 2020-07-06 ENCOUNTER — Encounter: Payer: Self-pay | Admitting: Internal Medicine

## 2020-07-06 NOTE — Progress Notes (Signed)
Virtual Visit via Video Note  I connected with Teresa Hubbard on 07/06/20 at  3:40 PM EDT by a video enabled telemedicine application and verified that I am speaking with the correct person using two identifiers.   I discussed the limitations of evaluation and management by telemedicine and the availability of in person appointments. The patient expressed understanding and agreed to proceed.  I was located at our [facility] office. The patient was at home. There was no one else present in the visit.   History of Present Illness:   Teresa Hubbard presents for diarrhea.  It started a few weeks ago in [place].  It watery stools 2-3 times a day.  No blood or mucus.  No fever.  No weight loss no nausea vomiting  Observations/Objective: The patient appears to be in no acute distress, looks well.  Assessment and Plan:  See my Assessment and Plan. Follow Up Instructions:    I discussed the assessment and treatment plan with the patient. The patient was provided an opportunity to ask questions and all were answered. The patient agreed with the plan and demonstrated an understanding of the instructions.   The patient was advised to call back or seek an in-person evaluation if the symptoms worsen or if the condition fails to improve as anticipated.  I provided face-to-face time during this encounter. We were at different locations.   Walker Kehr, MD

## 2020-07-09 ENCOUNTER — Other Ambulatory Visit: Payer: Self-pay

## 2020-07-09 ENCOUNTER — Ambulatory Visit (INDEPENDENT_AMBULATORY_CARE_PROVIDER_SITE_OTHER): Payer: 59 | Admitting: Internal Medicine

## 2020-07-09 ENCOUNTER — Encounter: Payer: Self-pay | Admitting: Internal Medicine

## 2020-07-09 VITALS — BP 120/82 | HR 65 | Temp 98.4°F | Ht 67.0 in | Wt 207.8 lb

## 2020-07-09 DIAGNOSIS — E6609 Other obesity due to excess calories: Secondary | ICD-10-CM | POA: Diagnosis not present

## 2020-07-09 DIAGNOSIS — E785 Hyperlipidemia, unspecified: Secondary | ICD-10-CM

## 2020-07-09 DIAGNOSIS — R197 Diarrhea, unspecified: Secondary | ICD-10-CM | POA: Insufficient documentation

## 2020-07-09 DIAGNOSIS — E538 Deficiency of other specified B group vitamins: Secondary | ICD-10-CM | POA: Diagnosis not present

## 2020-07-09 DIAGNOSIS — E559 Vitamin D deficiency, unspecified: Secondary | ICD-10-CM

## 2020-07-09 DIAGNOSIS — K5730 Diverticulosis of large intestine without perforation or abscess without bleeding: Secondary | ICD-10-CM | POA: Diagnosis not present

## 2020-07-09 DIAGNOSIS — K219 Gastro-esophageal reflux disease without esophagitis: Secondary | ICD-10-CM

## 2020-07-09 DIAGNOSIS — R1084 Generalized abdominal pain: Secondary | ICD-10-CM

## 2020-07-09 MED ORDER — PANTOPRAZOLE SODIUM 40 MG PO TBEC: 40.0000 mg | DELAYED_RELEASE_TABLET | Freq: Every day | ORAL | 3 refills | Status: DC

## 2020-07-09 MED ORDER — METAMUCIL 0.52 G PO CAPS: 0.5200 g | ORAL_CAPSULE | Freq: Two times a day (BID) | ORAL | 3 refills | Status: DC

## 2020-07-09 MED ORDER — VITAMIN B-12 1000 MCG SL SUBL: 0.5000 | SUBLINGUAL_TABLET | Freq: Every day | SUBLINGUAL | 3 refills | Status: AC

## 2020-07-09 MED ORDER — SACCHAROMYCES BOULARDII 250 MG PO CAPS: 250.0000 mg | ORAL_CAPSULE | Freq: Two times a day (BID) | ORAL | 1 refills | Status: DC

## 2020-07-09 MED ORDER — TRIAMCINOLONE ACETONIDE 0.1 % EX CREA
1.0000 | TOPICAL_CREAM | Freq: Three times a day (TID) | CUTANEOUS | 3 refills | Status: DC
Start: 2020-07-09 — End: 2020-11-11

## 2020-07-09 NOTE — Assessment & Plan Note (Signed)
On Vit d 

## 2020-07-09 NOTE — Assessment & Plan Note (Signed)
Wt Readings from Last 3 Encounters:  07/09/20 207 lb 12.8 oz (94.3 kg)  12/31/19 213 lb (96.6 kg)  01/31/19 208 lb (94.3 kg)

## 2020-07-09 NOTE — Assessment & Plan Note (Signed)
?  diverticulosis related pain vs other D/c statin; take Florastor, metamucil GI ref - Dr Ardis Hughs

## 2020-07-09 NOTE — Assessment & Plan Note (Signed)
S/p recent stay in [place] Stool tests ordered Florastor po

## 2020-07-09 NOTE — Assessment & Plan Note (Signed)
On B12 

## 2020-07-09 NOTE — Assessment & Plan Note (Signed)
D/c Statin

## 2020-07-09 NOTE — Progress Notes (Signed)
Subjective:  Patient ID: Teresa Hubbard, female    DOB: 03/25/1961  Age: 60 y.o. MRN: [id number]  CC: GI Problem (Pt states she been having a lot of digestion problems)   HPI Teresa Hubbard presents for loose stools, dark C/o wt gain, moody, insomnia, HAs  Outpatient Medications Prior to Visit  Medication Sig Dispense Refill  . atorvastatin (LIPITOR) 10 MG tablet Take 10 mg by mouth daily.    . Cholecalciferol (VITAMIN D3) 50 MCG (2000 UT) capsule Take 1 capsule (2,000 Units total) by mouth daily. 100 capsule 3  . Cyanocobalamin (VITAMIN B-12) 1000 MCG SUBL Place 1 tablet (1,000 mcg total) under the tongue daily. 100 tablet 3  . clotrimazole-betamethasone (LOTRISONE) cream Apply 1 application topically 2 (two) times daily. (Patient not taking: Reported on 07/09/2020) 45 g 3  . metroNIDAZOLE (METROCREAM) 0.75 % cream Apply topically 2 (two) times daily. (Patient not taking: Reported on 07/09/2020) 45 g 2   No facility-administered medications prior to visit.    ROS: Review of Systems  Constitutional: Positive for fatigue. Negative for activity change, appetite change, chills and unexpected weight change.  HENT: Negative for congestion, mouth sores and sinus pressure.   Eyes: Negative for visual disturbance.  Respiratory: Negative for cough and chest tightness.   Gastrointestinal: Negative for abdominal pain and nausea.  Genitourinary: Negative for difficulty urinating, frequency and vaginal pain.  Musculoskeletal: Negative for back pain and gait problem.  Skin: Negative for pallor and rash.  Neurological: Negative for dizziness, tremors, weakness, numbness and headaches.  Psychiatric/Behavioral: Positive for sleep disturbance. Negative for confusion. The patient is nervous/anxious.     Objective:  BP 120/82 (BP Location: Left Arm)   Pulse 65   Temp 98.4 F (36.9 C) (Oral)   Ht 5\' 7"  (1.702 m)   Wt 207 lb 12.8 oz (94.3 kg)   SpO2 96%   BMI 32.55 kg/m   BP Readings from Last 3  Encounters:  07/09/20 120/82  12/31/19 124/88  01/31/19 126/70    Wt Readings from Last 3 Encounters:  07/09/20 207 lb 12.8 oz (94.3 kg)  12/31/19 213 lb (96.6 kg)  01/31/19 208 lb (94.3 kg)    Physical Exam Constitutional:      General: She is not in acute distress.    Appearance: She is well-developed. She is obese.  HENT:     Head: Normocephalic.     Right Ear: External ear normal.     Left Ear: External ear normal.     Nose: Nose normal.  Eyes:     General:        Right eye: No discharge.        Left eye: No discharge.     Conjunctiva/sclera: Conjunctivae normal.     Pupils: Pupils are equal, round, and reactive to light.  Neck:     Thyroid: No thyromegaly.     Vascular: No JVD.     Trachea: No tracheal deviation.  Cardiovascular:     Rate and Rhythm: Normal rate and regular rhythm.     Heart sounds: Normal heart sounds.  Pulmonary:     Effort: No respiratory distress.     Breath sounds: No stridor. No wheezing.  Abdominal:     General: Bowel sounds are normal. There is no distension.     Palpations: Abdomen is soft. There is no mass.     Tenderness: There is no abdominal tenderness. There is no guarding or rebound.  Musculoskeletal:  General: No tenderness.     Cervical back: Normal range of motion and neck supple.  Lymphadenopathy:     Cervical: No cervical adenopathy.  Skin:    Findings: No erythema or rash.  Neurological:     Mental Status: She is oriented to person, place, and time.     Cranial Nerves: No cranial nerve deficit.     Motor: No abnormal muscle tone.     Coordination: Coordination normal.     Deep Tendon Reflexes: Reflexes normal.  Psychiatric:        Behavior: Behavior normal.        Thought Content: Thought content normal.        Judgment: Judgment normal.   sensitive LLQ  Lab Results  Component Value Date   WBC 4.9 12/31/2019   HGB 14.5 12/31/2019   HCT 43.0 12/31/2019   PLT 251.0 12/31/2019   GLUCOSE 90 12/31/2019    CHOL 335 (H) 12/31/2019   TRIG 266.0 (H) 12/31/2019   HDL 48.30 12/31/2019   LDLDIRECT 243.0 12/31/2019   LDLCALC 248 (H) 08/23/2018   ALT 31 12/31/2019   AST 16 12/31/2019   NA 140 12/31/2019   K 4.5 12/31/2019   CL 103 12/31/2019   CREATININE 0.95 12/31/2019   BUN 14 12/31/2019   CO2 28 12/31/2019   TSH 2.99 12/31/2019   PSA 0.00 (L) 01/22/2011   HGBA1C 5.4 08/23/2018    VAS US CAROTID  Result Date: 02/06/2019 Carotid Arterial Duplex Study Indications:  Bilateral bruits. No cerebrovascular symptoms. Risk Factors: No history of smoking. Performing Technologist: Mariane Masters RVT  Examination Guidelines: A complete evaluation includes B-mode imaging, spectral Doppler, color Doppler, and power Doppler as needed of all accessible portions of each vessel. Bilateral testing is considered an integral part of a complete examination. Limited examinations for reoccurring indications may be performed as noted.  Right Carotid Findings: +----------+--------+--------+--------+------------------+------------------+           PSV cm/sEDV cm/sStenosisPlaque DescriptionComments           +----------+--------+--------+--------+------------------+------------------+ CCA Prox  58      13                                                   +----------+--------+--------+--------+------------------+------------------+ CCA Distal90      23                                intimal thickening +----------+--------+--------+--------+------------------+------------------+ ICA Prox  74      19                                                   +----------+--------+--------+--------+------------------+------------------+ ICA Mid   62      25                                                   +----------+--------+--------+--------+------------------+------------------+ ICA Distal85      38                                                    +----------+--------+--------+--------+------------------+------------------+  ECA       109     18                                                   +----------+--------+--------+--------+------------------+------------------+ +----------+--------+-------+----------------+-------------------+           PSV cm/sEDV cmsDescribe        Arm Pressure (mmHG) +----------+--------+-------+----------------+-------------------+ [id number]            Multiphasic, [id number]                 +----------+--------+-------+----------------+-------------------+ +---------+--------+--+--------+--+---------+ VertebralPSV cm/s44EDV cm/s14Antegrade +---------+--------+--+--------+--+---------+  Left Carotid Findings: +----------+--------+--------+--------+------------------+------------------+           PSV cm/sEDV cm/sStenosisPlaque DescriptionComments           +----------+--------+--------+--------+------------------+------------------+ CCA Prox  117     30                                                   +----------+--------+--------+--------+------------------+------------------+ CCA Distal99      26                                intimal thickening +----------+--------+--------+--------+------------------+------------------+ ICA Prox  72      20                                                   +----------+--------+--------+--------+------------------+------------------+ ICA Mid   40      16                                tortuous           +----------+--------+--------+--------+------------------+------------------+ ICA Distal48      19                                                   +----------+--------+--------+--------+------------------+------------------+ ECA       110     30                                                   +----------+--------+--------+--------+------------------+------------------+  +----------+--------+--------+----------------+-------------------+           PSV cm/sEDV cm/sDescribe        Arm Pressure (mmHG) +----------+--------+--------+----------------+-------------------+ [id number]             Multiphasic, [id number]                 +----------+--------+--------+----------------+-------------------+ +---------+--------+--+--------+--+---------+ VertebralPSV cm/s41EDV cm/s14Antegrade +---------+--------+--+--------+--+---------+ The mid/distal ICA takes a deep dive. Summary: Right Carotid: There is no evidence of stenosis in the right ICA. The                extracranial vessels were near-normal with only  minimal wall                thickening or plaque. Left Carotid: There is no evidence of stenosis in the left ICA. The extracranial               vessels were near-normal with only minimal wall thickening or               plaque. Vertebrals:  Bilateral vertebral arteries demonstrate antegrade flow. Subclavians: Normal flow hemodynamics were seen in bilateral subclavian              arteries. *See table(s) above for measurements and observations.  Electronically signed by Carlyle Dolly MD on 02/06/2019 at 11:40:01 AM.    Final     Assessment & Plan:

## 2020-07-09 NOTE — Assessment & Plan Note (Signed)
?  diverticulosis related vs other D/c statin; take Florastor, metamucil GI ref - Dr Ardis Hughs

## 2020-07-14 ENCOUNTER — Encounter: Payer: Self-pay | Admitting: Internal Medicine

## 2020-07-18 ENCOUNTER — Encounter: Payer: Self-pay | Admitting: *Deleted

## 2020-09-19 ENCOUNTER — Telehealth: Payer: Self-pay | Admitting: Gastroenterology

## 2020-09-19 NOTE — Telephone Encounter (Signed)
Patient calling to inform he is experiencing heartburn//gas//. Pt wants to know how to treat sxs..  plz advise  thanks

## 2020-09-22 NOTE — Telephone Encounter (Signed)
Left message for the pt to return call to set up an appt to discuss symptoms.  Last seen 2018

## 2020-10-23 ENCOUNTER — Ambulatory Visit: Payer: 59 | Admitting: Nurse Practitioner

## 2020-11-11 ENCOUNTER — Ambulatory Visit (INDEPENDENT_AMBULATORY_CARE_PROVIDER_SITE_OTHER): Payer: 59 | Admitting: Gastroenterology

## 2020-11-11 ENCOUNTER — Encounter: Payer: Self-pay | Admitting: Gastroenterology

## 2020-11-11 VITALS — BP 122/78 | HR 92 | Ht 67.0 in | Wt 205.8 lb

## 2020-11-11 DIAGNOSIS — R194 Change in bowel habit: Secondary | ICD-10-CM

## 2020-11-11 DIAGNOSIS — Z800 Family history of malignant neoplasm of digestive organs: Secondary | ICD-10-CM

## 2020-11-11 MED ORDER — OMEPRAZOLE 20 MG PO CPDR: 20.0000 mg | DELAYED_RELEASE_CAPSULE | Freq: Every day | ORAL | 3 refills | Status: DC

## 2020-11-11 MED ORDER — SUPREP BOWEL PREP KIT 17.5-3.13-1.6 GM/177ML PO SOLN: ORAL | 0 refills | Status: DC

## 2020-11-11 NOTE — Progress Notes (Signed)
Review of pertinent gastrointestinal problems: 1. History of adenomatous colon polyp:  Colonoscopy 2011 found 3 small hyperplastic polyps as well as pan diverticulosis.  She was recommended to have repeat colonoscopy at 10 year interval.  Colonoscopy 06/2016 for minor rectal bleeding (likely hemorrhoidal) found internal hemorrhoids, left sided diverticulosis, 69m polyp (TA by pathology): recommended to have surveillance colonoscopy at 5 year interval.  Cologuard test, I believe ordered by her primary care physician, was negative for April 2022.  This was not an appropriate test since she has a history of adenomatous polyps. 2. Diverticulosis, confirmed by colonoscopy 2011. 3. diverticulitis, hepatic flexure August 2013 confirmed by CT scan; this was mild, treated quite easily with oral antibiotics.  Clinically she had had diverticulitis in the sigmoid colon once or twice previously. No CT scan proof of this however. 4.  Family history of colon cancer, her mother was diagnosed when she was about 89years old  HPI: This is a very pleasant 60year old woman who was referred to me by Plotnikov, AEvie Lacks MD  to evaluate change in bowel habits, heartburn  I last saw her almost 4 years ago, December 2018.  We discussed some left-sided abdominal pains [place] of her [place].  She took some antibiotics and her pain is completely improved.  She was however left with some loose stools and I put her on a single daily Imodium.  I also ordered a battery of stool tests and ruled out chronic infection.  We have not heard from her since.  She is here today for a different, new problem.  She has had epigastric burning after she eats for about 3 months.  This is intermittent.  She describes it as an acid, burning sensation.  She has no associated dysphagia or weight loss.  She has not tried anything for this heartburn type discomfort.  She has also had a change in  bowels.  [place]  Her brother prescribe her some type of antiparasitic medicine and her diarrhea improved but since then her stools have been very smelly and dark and have had a different consistency to them.  She has not seen overt blood.  She has not had significant abdominal pains.  Her weight has been overall stable.   Review of systems: Pertinent positive and negative review of systems were noted in the above HPI section. All other review negative.   Past Medical History:  Diagnosis Date   B12 DEFICIENCY 08/18/2007   DIVERTICULOSIS, COLON 02/17/2007   High cholesterol    Joint pain    Lack of energy    Weight gain     History reviewed. No pertinent surgical history.  Current Outpatient Medications  Medication Sig Dispense Refill   Cholecalciferol (VITAMIN D3) 50 MCG (2000 UT) capsule Take 1 capsule (2,000 Units total) by mouth daily. 100 capsule 3   Cyanocobalamin (VITAMIN B-12) 1000 MCG SUBL Place 0.5 tablets (500 mcg total) under the tongue daily. 100 tablet 3   No current facility-administered medications for this visit.    Allergies as of 11/11/2020 - Review Complete 11/11/2020  Allergen Reaction Noted   Atorvastatin  01/31/2019    Family History  Problem Relation Age of Onset   Cancer Mother    High blood pressure Mother    Colon cancer Mother    High blood pressure Father    High Cholesterol  Father    Ovarian cancer Maternal Aunt    Lung cancer Maternal Uncle     Social History   Socioeconomic History   Marital status: Married    Spouse name: Trilby Leaver   Number of children: 2   Years of education: Not on file   Highest education level: Not on file  Occupational History   Occupation: Armed forces operational officer: [employer]  Tobacco Use   Smoking status: Never   Smokeless tobacco: Never  Substance and Sexual Activity   Alcohol use: Yes    Comment: wine occasional    Drug use: No   Sexual activity: Yes  Other Topics Concern   Not on file  Social History Narrative   Not on file   Social Determinants of Health   Financial Resource Strain: Not on file  Food Insecurity: Not on file  Transportation Needs: Not on file  Physical Activity: Not on file  Stress: Not on file  Social Connections: Not on file  Intimate Partner Violence: Not on file     Physical Exam: BP 122/78   Pulse 92   Ht '5\' 7"'$  (1.702 m)   Wt 205 lb 12.8 oz (93.4 kg)   SpO2 98%   BMI 32.23 kg/m  Constitutional: generally well-appearing Psychiatric: alert and oriented x3 Eyes: extraocular movements intact Mouth: oral pharynx moist, no lesions Neck: supple no lymphadenopathy Cardiovascular: heart regular rate and rhythm Lungs: clear to auscultation bilaterally Abdomen: soft, nontender, nondistended, no obvious ascites, no peritoneal signs, normal bowel sounds Extremities: no lower extremity edema bilaterally Skin: no lesions on visible extremities   Assessment and plan: 60 y.o. female with GERD without alarm symptoms, change in bowel habits, family history of colon cancer  First she has GERD without alarm symptoms and I recommended a trial of over-the-counter strength proton pump inhibitor omeprazole at 20 mg.  You will take 1 pill shortly before breakfast every morning.  Second I recommended a colonoscopy for her change in bowel habits and family history of colon cancer.  I did explain to her that the Cologuard test which she had earlier this year was not appropriate for her since she has a family history of colon cancer and a personal history of precancerous colon polyps.  Please see the "Patient Instructions" section for addition details about the plan.   Owens Loffler, MD [facility] 11/11/2020, 4:01 PM  Cc: Cassandria Anger, MD  Total time on date of encounter was 45 minutes (this included time spent preparing to see the patient reviewing records;  obtaining and/or reviewing separately obtained history; performing a medically appropriate exam and/or evaluation; counseling and educating the patient and family if present; ordering medications, tests or procedures if applicable; and documenting clinical information in the health record).

## 2020-11-11 NOTE — Patient Instructions (Addendum)
If you are age 59 or older, your body mass index should be between 23-30. Your Body mass index is 32.23 kg/m. If this is out of the aforementioned range listed, please consider follow up with your Primary Care Provider.  If you are age 20 or younger, your body mass index should be between 19-25. Your Body mass index is 32.23 kg/m. If this is out of the aformentioned range listed, please consider follow up with your Primary Care Provider.   __________________________________________________________  The [facility] providers would like to encourage you to use [facility] to communicate with providers for non-urgent requests or questions.  Due to long hold times on the telephone, sending your provider a message by [facility] may be a faster and more efficient way to get a response.  Please allow 48 business hours for a response.  Please remember that this is for non-urgent requests.   You have been scheduled for a colonoscopy. Please follow written instructions given to you at your visit today.  Please pick up your prep supplies at the pharmacy within the next 1-3 days. If you use inhalers (even only as needed), please bring them with you on the day of your procedure.  Please purchase the following medications over the counter and take as directed: Omeprazole 20 mg: Take once daily  Thank you for entrusting me with your care and for choosing [employer], Dr. Oretha Caprice

## 2020-12-03 ENCOUNTER — Encounter: Payer: Self-pay | Admitting: Gastroenterology

## 2020-12-17 ENCOUNTER — Other Ambulatory Visit: Payer: Self-pay

## 2020-12-17 ENCOUNTER — Encounter: Payer: Self-pay | Admitting: Gastroenterology

## 2020-12-17 ENCOUNTER — Ambulatory Visit (AMBULATORY_SURGERY_CENTER): Payer: 59 | Admitting: Gastroenterology

## 2020-12-17 VITALS — BP 116/76 | HR 54 | Temp 97.8°F | Resp 12 | Ht 67.0 in | Wt 205.0 lb

## 2020-12-17 DIAGNOSIS — K5730 Diverticulosis of large intestine without perforation or abscess without bleeding: Secondary | ICD-10-CM | POA: Diagnosis not present

## 2020-12-17 DIAGNOSIS — D123 Benign neoplasm of transverse colon: Secondary | ICD-10-CM | POA: Diagnosis not present

## 2020-12-17 DIAGNOSIS — R194 Change in bowel habit: Secondary | ICD-10-CM

## 2020-12-17 DIAGNOSIS — Z800 Family history of malignant neoplasm of digestive organs: Secondary | ICD-10-CM

## 2020-12-17 MED ORDER — SODIUM CHLORIDE 0.9 % IV SOLN: 500.0000 mL | Freq: Once | INTRAVENOUS | Status: DC

## 2020-12-17 NOTE — Progress Notes (Signed)
Review of pertinent gastrointestinal problems: 1. History of adenomatous colon polyp:  Colonoscopy 2011 found 3 small hyperplastic polyps as well as pan diverticulosis.  She was recommended to have repeat colonoscopy at 10 year interval.  Colonoscopy 06/2016 for minor rectal bleeding (likely hemorrhoidal) found internal hemorrhoids, left sided diverticulosis, 14m polyp (TA by pathology): recommended to have surveillance colonoscopy at 5 year interval.  Cologuard test, I believe ordered by her primary care physician, was negative for April 2022.  This was not an appropriate test since she has a history of adenomatous polyps. 2. Diverticulosis, confirmed by colonoscopy 2011. 3. diverticulitis, hepatic flexure August 2013 confirmed by CT scan; this was mild, treated quite easily with oral antibiotics.  Clinically she had had diverticulitis in the sigmoid colon once or twice previously. No CT scan proof of this however. 4.  Family history of colon cancer, her mother was diagnosed when she was about 89years old   HPI: This is a woman with change in her bowel habits, FH of CRC   ROS: complete GI ROS as described in HPI, all other review negative.  Constitutional:  No unintentional weight loss   Past Medical History:  Diagnosis Date   B12 DEFICIENCY 08/18/2007   DIVERTICULOSIS, COLON 02/17/2007   High cholesterol    Joint pain    Lack of energy    Weight gain     History reviewed. No pertinent surgical history.  Current Outpatient Medications  Medication Sig Dispense Refill   Cholecalciferol (VITAMIN D3) 50 MCG (2000 UT) capsule Take 1 capsule (2,000 Units total) by mouth daily. 100 capsule 3   Cyanocobalamin (VITAMIN B-12) 1000 MCG SUBL Place 0.5 tablets (500 mcg total) under the tongue daily. 100 tablet 3   SUPREP BOWEL PREP KIT 17.5-3.13-1.6 GM/177ML SOLN Suprep-Use as directed 354 mL 0   omeprazole (PRILOSEC) 20 MG capsule Take 1 capsule (20 mg total) by mouth daily. 30 capsule 3    Current Facility-Administered Medications  Medication Dose Route Frequency Provider Last Rate Last Admin   0.9 %  sodium chloride infusion  500 mL Intravenous Once JMilus Banister MD        Allergies as of 12/17/2020 - Review Complete 12/17/2020  Allergen Reaction Noted   Atorvastatin  01/31/2019    Family History  Problem Relation Age of Onset   Cancer Mother    High blood pressure Mother    Colon cancer Mother    High blood pressure Father    High Cholesterol Father    Ovarian cancer Maternal Aunt    Lung cancer Maternal Uncle    Esophageal cancer Neg Hx    Rectal cancer Neg Hx    Stomach cancer Neg Hx     Social History   Socioeconomic History   Marital status: Married    Spouse name: FTrilby Leaver  Number of children: 2   Years of education: Not on file   Highest education level: Not on file  Occupational History   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[employer]  Tobacco Use   Smoking status: Never   Smokeless tobacco: Never  Vaping Use   Vaping Use: Never used  Substance and Sexual Activity   Alcohol use: Yes    Comment: wine occasional   Drug use: No   Sexual activity: Yes  Other Topics Concern   Not on file  Social History Narrative   Not on file   Social Determinants of Health   Financial Resource Strain: Not on file  Food Insecurity: Not on file  Transportation Needs: Not on file  Physical Activity: Not on file  Stress: Not on file  Social Connections: Not on file  Intimate Partner Violence: Not on file     Physical Exam: BP 120/76   Pulse 63   Temp 97.8 F (36.6 C)   Ht 5' 7"  (1.702 m)   Wt 205 lb (93 kg)   LMP  (Approximate)   SpO2 99%   BMI 32.11 kg/m  Constitutional: generally well-appearing Psychiatric: alert and oriented x3 Lungs: CTA bilaterally Heart: no MCR  Assessment and plan: 60 y.o. female with change in bowel habits, fh of CRC  Colonoscopy today  Care is appropriate for the ambulatory setting.  Owens Loffler,  MD [facility] 12/17/2020, 1:42 PM

## 2020-12-17 NOTE — Progress Notes (Signed)
Called to room to assist during endoscopic procedure.  Patient ID and intended procedure confirmed with present staff. Received instructions for my participation in the procedure from the performing physician.  

## 2020-12-17 NOTE — Op Note (Signed)
[facility] Patient Name: Teresa Hubbard Procedure Date: 12/17/2020 1:44 PM MRN: [id number] Endoscopist: Milus Banister , MD Age: 60 Referring MD:  Date of Birth: 30-Mar-1960 Gender: Female Account #: [id number] Procedure:                Colonoscopy Indications:              Change in bowel habits, mother dx with CRC around                            age 70, Colonoscopy 2011 found 3 small hyperplastic                            polyps as well as pan diverticulosis. She was                            recommended to have repeat colonoscopy at 10 year                            interval. Colonoscopy 06/2016 for minor rectal                            bleeding (likely hemorrhoidal) found internal                            hemorrhoids, left sided diverticulosis, 23mm polyp                            (TA by pathology): recommended to have surveillance                            colonoscopy at 5 year interval. Medicines:                Monitored Anesthesia Care Procedure:                Pre-Anesthesia Assessment:                           - Prior to the procedure, a History and Physical                            was performed, and patient medications and                            allergies were reviewed. The patient's tolerance of                            previous anesthesia was also reviewed. The risks                            and benefits of the procedure and the sedation                            options and risks were discussed with the patient.  All questions were answered, and informed consent                            was obtained. Prior Anticoagulants: The patient has                            taken no previous anticoagulant or antiplatelet                            agents. ASA Grade Assessment: II - A patient with                            mild systemic disease. After reviewing the risks                            and benefits, the  patient was deemed in                            satisfactory condition to undergo the procedure.                           After obtaining informed consent, the colonoscope                            was passed under direct vision. Throughout the                            procedure, the patient's blood pressure, pulse, and                            oxygen saturations were monitored continuously. The                            Olympus CF-HQ190L [id number]) Colonoscope was                            introduced through the anus and advanced to the the                            terminal ileum. The colonoscopy was performed                            without difficulty. The patient tolerated the                            procedure well. The quality of the bowel                            preparation was good. The ileocecal valve,                            appendiceal orifice, and rectum were photographed. Scope In: 1:52:15 PM Scope Out: 2:03:57 PM Scope Withdrawal Time: 0 hours 9 minutes 20 seconds  Total Procedure Duration:  0 hours 11 minutes 42 seconds  Findings:                 The terminal ileum appeared normal.                           A 4 mm polyp was found in the transverse colon. The                            polyp was sessile. The polyp was removed with a                            cold snare. Resection and retrieval were complete.                           Multiple small and large-mouthed diverticula were                            found in the left colon.                           Biopsies for histology were taken with a cold                            forceps from the entire colon for evaluation of                            microscopic colitis.                           The exam was otherwise without abnormality on                            direct and retroflexion views. Complications:            No immediate complications. Estimated blood loss:                             None. Estimated Blood Loss:     Estimated blood loss: none. Impression:               - The examined portion of the ileum was normal.                           - One 4 mm polyp in the transverse colon, removed                            with a cold snare. Resected and retrieved.                           - Diverticulosis in the left colon.                           - The examination was otherwise normal on direct  and retroflexion views.                           - Biopsies were taken with a cold forceps from the                            entire colon for evaluation of microscopic colitis. Recommendation:           - Patient has a contact number available for                            emergencies. The signs and symptoms of potential                            delayed complications were discussed with the                            patient. Return to normal activities tomorrow.                            Written discharge instructions were provided to the                            patient.                           - Resume previous diet.                           - Continue present medications.                           - Await pathology results. Milus Banister, MD 12/17/2020 2:07:46 PM This report has been signed electronically.

## 2020-12-17 NOTE — Progress Notes (Signed)
PT taken to PACU. Monitors in place. VSS. Report given to RN. 

## 2020-12-17 NOTE — Progress Notes (Signed)
VS taken by DT 

## 2020-12-17 NOTE — Patient Instructions (Signed)
Thank you for letting us take care of your heatlhcare needs today. Please see handouts given to you on Polyps and Diverticulosis.    YOU HAD AN ENDOSCOPIC PROCEDURE TODAY AT [facility]:   Refer to the procedure report that was given to you for any specific questions about what was found during the examination.  If the procedure report does not answer your questions, please call your gastroenterologist to clarify.  If you requested that your care partner not be given the details of your procedure findings, then the procedure report has been included in a sealed envelope for you to review at your convenience later.  YOU SHOULD EXPECT: Some feelings of bloating in the abdomen. Passage of more gas than usual.  Walking can help get rid of the air that was put into your GI tract during the procedure and reduce the bloating. If you had a lower endoscopy (such as a colonoscopy or flexible sigmoidoscopy) you may notice spotting of blood in your stool or on the toilet paper. If you underwent a bowel prep for your procedure, you may not have a normal bowel movement for a few days.  Please Note:  You might notice some irritation and congestion in your nose or some drainage.  This is from the oxygen used during your procedure.  There is no need for concern and it should clear up in a day or so.  SYMPTOMS TO REPORT IMMEDIATELY:  Following lower endoscopy (colonoscopy or flexible sigmoidoscopy):  Excessive amounts of blood in the stool  Significant tenderness or worsening of abdominal pains  Swelling of the abdomen that is new, acute  Fever of 100F or higher  For urgent or emergent issues, a gastroenterologist can be reached at any hour by calling [phone]. Do not use MyChart messaging for urgent concerns.    DIET:  We do recommend a small meal at first, but then you may proceed to your regular diet.  Drink plenty of fluids but you should avoid alcoholic beverages for 24  hours.  ACTIVITY:  You should plan to take it easy for the rest of today and you should NOT DRIVE or use heavy machinery until tomorrow (because of the sedation medicines used during the test).    FOLLOW UP: Our staff will call the number listed on your records 48-72 hours following your procedure to check on you and address any questions or concerns that you may have regarding the information given to you following your procedure. If we do not reach you, we will leave a message.  We will attempt to reach you two times.  During this call, we will ask if you have developed any symptoms of COVID 19. If you develop any symptoms (ie: fever, flu-like symptoms, shortness of breath, cough etc.) before then, please call [phone].  If you test positive for Covid 19 in the 2 weeks post procedure, please call and report this information to [place].    If any biopsies were taken you will be contacted by phone or by letter within the next 1-3 weeks.  Please call us at [phone] if you have not heard about the biopsies in 3 weeks.    SIGNATURES/CONFIDENTIALITY: You and/or your care partner have signed paperwork which will be entered into your electronic medical record.  These signatures attest to the fact that that the information above on your After Visit Summary has been reviewed and is understood.  Full responsibility of the confidentiality of this discharge information lies  with you and/or your care-partner.  

## 2020-12-19 ENCOUNTER — Telehealth: Payer: Self-pay | Admitting: *Deleted

## 2020-12-19 ENCOUNTER — Telehealth: Payer: Self-pay

## 2020-12-19 NOTE — Telephone Encounter (Signed)
Attempted to call patient for their post-procedure follow-up call. No answer. Left voicemail.   

## 2020-12-19 NOTE — Telephone Encounter (Signed)
Left message on answering machine. 

## 2020-12-23 ENCOUNTER — Encounter: Payer: Self-pay | Admitting: Gastroenterology

## 2021-01-12 ENCOUNTER — Other Ambulatory Visit: Payer: 59

## 2021-01-12 ENCOUNTER — Telehealth: Payer: Self-pay | Admitting: Internal Medicine

## 2021-01-12 NOTE — Telephone Encounter (Signed)
Type of form received- Lab Order from [facility]   Form placed in- Provider mailbox   Additional instructions from the patient- Notify pt. When orders have been put in to schedule an appt.

## 2021-01-28 ENCOUNTER — Ambulatory Visit (INDEPENDENT_AMBULATORY_CARE_PROVIDER_SITE_OTHER): Payer: 59 | Admitting: Internal Medicine

## 2021-01-28 ENCOUNTER — Other Ambulatory Visit: Payer: Self-pay

## 2021-01-28 ENCOUNTER — Encounter: Payer: Self-pay | Admitting: Internal Medicine

## 2021-01-28 VITALS — BP 126/82 | HR 57 | Temp 98.2°F | Ht 67.0 in | Wt 197.0 lb

## 2021-01-28 DIAGNOSIS — Z0000 Encounter for general adult medical examination without abnormal findings: Secondary | ICD-10-CM | POA: Diagnosis not present

## 2021-01-28 DIAGNOSIS — R197 Diarrhea, unspecified: Secondary | ICD-10-CM

## 2021-01-28 DIAGNOSIS — R140 Abdominal distension (gaseous): Secondary | ICD-10-CM

## 2021-01-28 DIAGNOSIS — E538 Deficiency of other specified B group vitamins: Secondary | ICD-10-CM

## 2021-01-28 DIAGNOSIS — M25561 Pain in right knee: Secondary | ICD-10-CM | POA: Diagnosis not present

## 2021-01-28 MED ORDER — METRONIDAZOLE 500 MG PO TABS: 500.0000 mg | ORAL_TABLET | Freq: Three times a day (TID) | ORAL | 0 refills | Status: DC

## 2021-01-28 MED ORDER — ALIGN 4 MG PO CAPS: 1.0000 | ORAL_CAPSULE | Freq: Every day | ORAL | 1 refills | Status: DC

## 2021-01-28 NOTE — Assessment & Plan Note (Addendum)
Chronic recurrent arthralgias.  Check RF, uric acid and ESR

## 2021-01-28 NOTE — Assessment & Plan Note (Signed)
On B12 

## 2021-01-28 NOTE — Assessment & Plan Note (Addendum)
Chronic.  Unclear etiology.  Status post GI evaluation with Dr. Ardis Hughs. Will start on Flagyl to eliminate unwanted intestinal flora and treat with align

## 2021-01-28 NOTE — Progress Notes (Signed)
Subjective:  Patient ID: Teresa Hubbard, female    DOB: Apr 05, 1960  Age: 60 y.o. MRN: [id number]  CC: Annual Exam   HPI ERNESHA RAMONE presents for a well exam C/o bloating, loose stool. Pt saw Dr Ardis Hughs - pt had a colonoscopy On a  well water  Outpatient Medications Prior to Visit  Medication Sig Dispense Refill   Cholecalciferol (VITAMIN D3) 50 MCG (2000 UT) capsule Take 1 capsule (2,000 Units total) by mouth daily. 100 capsule 3   Cyanocobalamin (VITAMIN B-12) 1000 MCG SUBL Place 0.5 tablets (500 mcg total) under the tongue daily. 100 tablet 3   omeprazole (PRILOSEC) 20 MG capsule Take 1 capsule (20 mg total) by mouth daily. (Patient not taking: Reported on 01/28/2021) 30 capsule 3   SUPREP BOWEL PREP KIT 17.5-3.13-1.6 GM/177ML SOLN Suprep-Use as directed (Patient not taking: Reported on 01/28/2021) 354 mL 0   No facility-administered medications prior to visit.    ROS: Review of Systems  Constitutional:  Negative for activity change, appetite change, chills, fatigue and unexpected weight change.  HENT:  Negative for congestion, mouth sores and sinus pressure.   Eyes:  Negative for visual disturbance.  Respiratory:  Negative for cough and chest tightness.   Gastrointestinal:  Positive for abdominal distention and diarrhea. Negative for abdominal pain, blood in stool, nausea and vomiting.  Genitourinary:  Negative for difficulty urinating, frequency and vaginal pain.  Musculoskeletal:  Negative for back pain and gait problem.  Skin:  Negative for pallor and rash.  Neurological:  Negative for dizziness, tremors, weakness, numbness and headaches.  Psychiatric/Behavioral:  Negative for confusion and sleep disturbance.    Objective:  BP 126/82 (BP Location: Left Arm)   Pulse (!) 57   Temp 98.2 F (36.8 C) (Oral)   Ht _0  (1.702 m)   Wt 197 lb (89.4 kg)   SpO2 97%   BMI 30.85 kg/m   BP Readings from Last 3 Encounters:  01/28/21 126/82  12/17/20 116/76  11/11/20 122/78     Wt Readings from Last 3 Encounters:  01/28/21 197 lb (89.4 kg)  12/17/20 205 lb (93 kg)  11/11/20 205 lb 12.8 oz (93.4 kg)    Physical Exam Constitutional:      General: She is not in acute distress.    Appearance: She is well-developed.  HENT:     Head: Normocephalic.     Right Ear: External ear normal.     Left Ear: External ear normal.     Nose: Nose normal.  Eyes:     General:        Right eye: No discharge.        Left eye: No discharge.     Conjunctiva/sclera: Conjunctivae normal.     Pupils: Pupils are equal, round, and reactive to light.  Neck:     Thyroid: No thyromegaly.     Vascular: No JVD.     Trachea: No tracheal deviation.  Cardiovascular:     Rate and Rhythm: Normal rate and regular rhythm.     Heart sounds: Normal heart sounds.  Pulmonary:     Effort: No respiratory distress.     Breath sounds: No stridor. No wheezing.  Abdominal:     General: Bowel sounds are normal. There is no distension.     Palpations: Abdomen is soft. There is no mass.     Tenderness: There is no abdominal tenderness. There is no guarding or rebound.  Musculoskeletal:  General: No tenderness.     Cervical back: Normal range of motion and neck supple. No rigidity.  Lymphadenopathy:     Cervical: No cervical adenopathy.  Skin:    Findings: No erythema or rash.  Neurological:     Cranial Nerves: No cranial nerve deficit.     Motor: No abnormal muscle tone.     Coordination: Coordination normal.     Deep Tendon Reflexes: Reflexes normal.  Psychiatric:        Behavior: Behavior normal.        Thought Content: Thought content normal.        Judgment: Judgment normal.   I spent 23 minutes in addition to time for CPX wellness examination in preparing to see the patient by review of recent labs, imaging and procedures, obtaining and reviewing separately obtained history, communicating with the patient, ordering medications, tests or procedures, and documenting clinical  information in the EHR including the differential diagnosis, treatment, and any further evaluation and other management of diarrhea, bloating, arthralgia.        Lab Results  Component Value Date   WBC 4.9 12/31/2019   HGB 14.5 12/31/2019   HCT 43.0 12/31/2019   PLT 251.0 12/31/2019   GLUCOSE 90 12/31/2019   CHOL 335 (H) 12/31/2019   TRIG 266.0 (H) 12/31/2019   HDL 48.30 12/31/2019   LDLDIRECT 243.0 12/31/2019   LDLCALC 248 (H) 08/23/2018   ALT 31 12/31/2019   AST 16 12/31/2019   NA 140 12/31/2019   K 4.5 12/31/2019   CL 103 12/31/2019   CREATININE 0.95 12/31/2019   BUN 14 12/31/2019   CO2 28 12/31/2019   TSH 2.99 12/31/2019   PSA 0.00 (L) 01/22/2011   HGBA1C 5.4 08/23/2018    VAS US CAROTID  Result Date: 02/06/2019 Carotid Arterial Duplex Study Indications:  Bilateral bruits. No cerebrovascular symptoms. Risk Factors: No history of smoking. Performing Technologist: Mariane Masters RVT  Examination Guidelines: A complete evaluation includes B-mode imaging, spectral Doppler, color Doppler, and power Doppler as needed of all accessible portions of each vessel. Bilateral testing is considered an integral part of a complete examination. Limited examinations for reoccurring indications may be performed as noted.  Right Carotid Findings: +----------+--------+--------+--------+------------------+------------------+           PSV cm/sEDV cm/sStenosisPlaque DescriptionComments           +----------+--------+--------+--------+------------------+------------------+ CCA Prox  58      13                                                   +----------+--------+--------+--------+------------------+------------------+ CCA Distal90      23                                intimal thickening +----------+--------+--------+--------+------------------+------------------+ ICA Prox  74      19                                                    +----------+--------+--------+--------+------------------+------------------+ ICA Mid   62      25                                                   +----------+--------+--------+--------+------------------+------------------+  ICA Distal85      38                                                   +----------+--------+--------+--------+------------------+------------------+ ECA       109     18                                                   +----------+--------+--------+--------+------------------+------------------+ +----------+--------+-------+----------------+-------------------+           PSV cm/sEDV cmsDescribe        Arm Pressure (mmHG) +----------+--------+-------+----------------+-------------------+ [id number]            Multiphasic, [id number]                 +----------+--------+-------+----------------+-------------------+ +---------+--------+--+--------+--+---------+ VertebralPSV cm/s44EDV cm/s14Antegrade +---------+--------+--+--------+--+---------+  Left Carotid Findings: +----------+--------+--------+--------+------------------+------------------+           PSV cm/sEDV cm/sStenosisPlaque DescriptionComments           +----------+--------+--------+--------+------------------+------------------+ CCA Prox  117     30                                                   +----------+--------+--------+--------+------------------+------------------+ CCA Distal99      26                                intimal thickening +----------+--------+--------+--------+------------------+------------------+ ICA Prox  72      20                                                   +----------+--------+--------+--------+------------------+------------------+ ICA Mid   40      16                                tortuous           +----------+--------+--------+--------+------------------+------------------+ ICA Distal48      19                                                    +----------+--------+--------+--------+------------------+------------------+ ECA       110     30                                                   +----------+--------+--------+--------+------------------+------------------+ +----------+--------+--------+----------------+-------------------+           PSV cm/sEDV cm/sDescribe        Arm Pressure (mmHG) +----------+--------+--------+----------------+-------------------+ [id number]             Multiphasic, [id number]                 +----------+--------+--------+----------------+-------------------+ +---------+--------+--+--------+--+---------+  VertebralPSV cm/s41EDV cm/s14Antegrade +---------+--------+--+--------+--+---------+ The mid/distal ICA takes a deep dive. Summary: Right Carotid: There is no evidence of stenosis in the right ICA. The                extracranial vessels were near-normal with only minimal wall                thickening or plaque. Left Carotid: There is no evidence of stenosis in the left ICA. The extracranial               vessels were near-normal with only minimal wall thickening or               plaque. Vertebrals:  Bilateral vertebral arteries demonstrate antegrade flow. Subclavians: Normal flow hemodynamics were seen in bilateral subclavian              arteries. *See table(s) above for measurements and observations.  Electronically signed by Carlyle Dolly MD on 02/06/2019 at 11:40:01 AM.    Final     Assessment & Plan:   Problem List Items Addressed This Visit     Arthralgia    Chronic recurrent arthralgias.  Check RF, uric acid and ESR      Relevant Orders   Rheumatoid factor   Uric acid   Vitamin B12   VITAMIN D 25 Hydroxy (Vit-D Deficiency, Fractures)   B12 deficiency    On B12      Relevant Orders   Vitamin B12   Bloating    Chronic.  Unclear etiology.  Status post GI evaluation with Dr. Ardis Hughs. Will start on Flagyl to eliminate unwanted intestinal flora  and treat with align      Diarrhea    Chronic.  Unclear etiology.  Status post GI evaluation with Dr. Ardis Hughs. Will start on Flagyl to eliminate unwanted intestinal flora and treat with align      Well adult exam - Primary    We discussed age appropriate health related issues, including available/recomended screening tests and vaccinations. We discussed a need for adhering to healthy diet and exercise. Labs were ordered to be later reviewed . All questions were answered. 2020 Coronary calcium score 0      Relevant Orders   TSH   Urinalysis   CBC with Differential/Platelet   Lipid panel   Comprehensive metabolic panel   Rheumatoid factor   Uric acid   Vitamin B12      Meds ordered this encounter  Medications   metroNIDAZOLE (FLAGYL) 500 MG tablet    Sig: Take 1 tablet (500 mg total) by mouth 3 (three) times daily.    Dispense:  30 tablet    Refill:  0   Probiotic Product (ALIGN) 4 MG CAPS    Sig: Take 1 capsule (4 mg total) by mouth daily.    Dispense:  30 capsule    Refill:  1      Follow-up: Return in about 1 year (around 01/28/2022) for Wellness Exam.  Walker Kehr, MD

## 2021-01-31 NOTE — Assessment & Plan Note (Signed)
Chronic.  Unclear etiology.  Status post GI evaluation with Dr. Ardis Hughs. Will start on Flagyl to eliminate unwanted intestinal flora and treat with align

## 2021-01-31 NOTE — Assessment & Plan Note (Signed)
We discussed age appropriate health related issues, including available/recomended screening tests and vaccinations. We discussed a need for adhering to healthy diet and exercise. Labs were ordered to be later reviewed . All questions were answered. 2020 Coronary calcium score 0 

## 2021-02-02 ENCOUNTER — Other Ambulatory Visit (INDEPENDENT_AMBULATORY_CARE_PROVIDER_SITE_OTHER): Payer: 59

## 2021-02-02 DIAGNOSIS — E538 Deficiency of other specified B group vitamins: Secondary | ICD-10-CM | POA: Diagnosis not present

## 2021-02-02 DIAGNOSIS — Z0000 Encounter for general adult medical examination without abnormal findings: Secondary | ICD-10-CM | POA: Diagnosis not present

## 2021-02-02 DIAGNOSIS — M25561 Pain in right knee: Secondary | ICD-10-CM | POA: Diagnosis not present

## 2021-02-02 LAB — URINALYSIS, ROUTINE W REFLEX MICROSCOPIC
Bilirubin Urine: NEGATIVE
Hgb urine dipstick: NEGATIVE
Ketones, ur: NEGATIVE
Nitrite: NEGATIVE
RBC / HPF: NONE SEEN (ref 0–?)
Specific Gravity, Urine: 1.025 (ref 1.000–1.030)
Total Protein, Urine: NEGATIVE
Urine Glucose: NEGATIVE
Urobilinogen, UA: 0.2 (ref 0.0–1.0)
pH: 6 (ref 5.0–8.0)

## 2021-02-02 LAB — CBC WITH DIFFERENTIAL/PLATELET
Basophils Absolute: 0.1 10*3/uL (ref 0.0–0.1)
Basophils Relative: 2.1 % (ref 0.0–3.0)
Eosinophils Absolute: 0.2 10*3/uL (ref 0.0–0.7)
Eosinophils Relative: 5.1 % — ABNORMAL HIGH (ref 0.0–5.0)
HCT: 40.9 % (ref 36.0–46.0)
Hemoglobin: 13.8 g/dL (ref 12.0–15.0)
Lymphocytes Relative: 43.6 % (ref 12.0–46.0)
Lymphs Abs: 1.4 10*3/uL (ref 0.7–4.0)
MCHC: 33.8 g/dL (ref 30.0–36.0)
MCV: 88.9 fl (ref 78.0–100.0)
Monocytes Absolute: 0.2 10*3/uL (ref 0.1–1.0)
Monocytes Relative: 7.1 % (ref 3.0–12.0)
Neutro Abs: 1.3 10*3/uL — ABNORMAL LOW (ref 1.4–7.7)
Neutrophils Relative %: 42.1 % — ABNORMAL LOW (ref 43.0–77.0)
Platelets: 245 10*3/uL (ref 150.0–400.0)
RBC: 4.61 Mil/uL (ref 3.87–5.11)
RDW: 13 % (ref 11.5–15.5)
WBC: 3.1 10*3/uL — ABNORMAL LOW (ref 4.0–10.5)

## 2021-02-02 LAB — VITAMIN B12: Vitamin B-12: 831 pg/mL (ref 211–911)

## 2021-02-02 LAB — COMPREHENSIVE METABOLIC PANEL
ALT: 22 U/L (ref 0–35)
AST: 17 U/L (ref 0–37)
Albumin: 4.1 g/dL (ref 3.5–5.2)
Alkaline Phosphatase: 39 U/L (ref 39–117)
BUN: 14 mg/dL (ref 6–23)
CO2: 28 mEq/L (ref 19–32)
Calcium: 9.4 mg/dL (ref 8.4–10.5)
Chloride: 107 mEq/L (ref 96–112)
Creatinine, Ser: 0.9 mg/dL (ref 0.40–1.20)
GFR: 69.93 mL/min (ref >60.00)
Glucose, Bld: 100 mg/dL — ABNORMAL HIGH (ref 70–99)
Potassium: 4.5 mEq/L (ref 3.5–5.1)
Sodium: 142 mEq/L (ref 135–145)
Total Bilirubin: 0.5 mg/dL (ref 0.2–1.2)
Total Protein: 6.9 g/dL (ref 6.0–8.3)

## 2021-02-02 LAB — LIPID PANEL
Cholesterol: 312 mg/dL — ABNORMAL HIGH (ref 0–200)
HDL: 50.1 mg/dL (ref >39.00)
LDL Cholesterol: 227 mg/dL — ABNORMAL HIGH (ref 0–99)
NonHDL: 261.44
Total CHOL/HDL Ratio: 6
Triglycerides: 174 mg/dL — ABNORMAL HIGH (ref 0.0–149.0)
VLDL: 34.8 mg/dL (ref 0.0–40.0)

## 2021-02-02 LAB — TSH: TSH: 3.32 u[IU]/mL (ref 0.35–5.50)

## 2021-02-02 LAB — VITAMIN D 25 HYDROXY (VIT D DEFICIENCY, FRACTURES): VITD: 56.12 ng/mL (ref 30.00–100.00)

## 2021-02-02 LAB — URIC ACID: Uric Acid, Serum: 5.3 mg/dL (ref 2.4–7.0)

## 2021-02-03 LAB — RHEUMATOID FACTOR: Rheumatoid fact SerPl-aCnc: <14 IU/mL (ref <14)

## 2021-05-07 ENCOUNTER — Telehealth: Payer: Self-pay | Admitting: Internal Medicine

## 2021-05-07 NOTE — Telephone Encounter (Signed)
Lipitor was stopped because it gave Dawnell arthralgias.  It is marked as intolerance. Thanks

## 2021-05-07 NOTE — Telephone Encounter (Signed)
Pt requesting rx for Lipitor stating it is for her "cholesterol"  Pharmacy  [id number] - [place], [place] - [address]

## 2021-06-04 ENCOUNTER — Other Ambulatory Visit: Payer: Self-pay

## 2021-06-04 ENCOUNTER — Encounter: Payer: Self-pay | Admitting: Internal Medicine

## 2021-06-04 ENCOUNTER — Ambulatory Visit (INDEPENDENT_AMBULATORY_CARE_PROVIDER_SITE_OTHER): Payer: 59 | Admitting: Internal Medicine

## 2021-06-04 DIAGNOSIS — M25561 Pain in right knee: Secondary | ICD-10-CM

## 2021-06-04 DIAGNOSIS — Z86010 Personal history of colonic polyps: Secondary | ICD-10-CM

## 2021-06-04 DIAGNOSIS — K5790 Diverticulosis of intestine, part unspecified, without perforation or abscess without bleeding: Secondary | ICD-10-CM | POA: Insufficient documentation

## 2021-06-04 DIAGNOSIS — K219 Gastro-esophageal reflux disease without esophagitis: Secondary | ICD-10-CM

## 2021-06-04 DIAGNOSIS — K5730 Diverticulosis of large intestine without perforation or abscess without bleeding: Secondary | ICD-10-CM

## 2021-06-04 DIAGNOSIS — R1032 Left lower quadrant pain: Secondary | ICD-10-CM | POA: Insufficient documentation

## 2021-06-04 MED ORDER — ICOSAPENT ETHYL 1 G PO CAPS
2.0000 g | ORAL_CAPSULE | Freq: Two times a day (BID) | ORAL | 3 refills | Status: AC
Start: 2021-06-04 — End: ?

## 2021-06-04 MED ORDER — FAMOTIDINE 40 MG PO TABS: 40.0000 mg | ORAL_TABLET | Freq: Every day | ORAL | 3 refills | Status: DC

## 2021-06-04 NOTE — Assessment & Plan Note (Signed)
GI  - Dr Ardis Hughs ?

## 2021-06-04 NOTE — Patient Instructions (Signed)
Gluten free trial for 4-6 weeks. OK to use gluten-free bread and gluten-free pasta.    Gluten-Free Diet for Celiac Disease, Adult The gluten-free diet includes all foods that do not contain gluten. Gluten is a protein that is found in wheat, rye, barley, and some other grains. Following the gluten-free diet is the only treatment for people with celiac disease. It helps to prevent damage to the intestines and improves or eliminates the symptoms of celiac disease. Following the gluten-free diet requires some planning. It can be challenging at first, but it gets easier with time and practice. There are more gluten-free options available today than ever before. If you need help finding gluten-free foods or if you have questions, talk with your diet and nutrition specialist (registered dietitian) or your health care provider. What do I need to know about a gluten-free diet? All fruits, vegetables, and meats are safe to eat and do not contain gluten. When grocery shopping, start by shopping in the produce, meat, and dairy sections. These sections are more likely to contain gluten-free foods. Then move to the aisles that contain packaged foods if you need to. Read all food labels. Gluten is often added to foods. Always check the ingredient list and look for warnings, such as may contain gluten." Talk with your dietitian or health care provider before taking a gluten-free multivitamin or mineral supplement. Be aware of gluten-free foods having contact with foods that contain gluten (cross-contamination). This can happen at home and with any processed foods. Talk with your health care provider or dietitian about how to reduce the risk of cross-contamination in your home. If you have questions about how a food is processed, ask the manufacturer. What key words help to identify gluten? Foods that list any of these key words on the label usually contain gluten: Wheat, flour, enriched flour, bromated  flour, white flour, durum flour, graham flour, phosphated flour, self-rising flour, semolina, farina, barley (malt), rye, and oats. Starch, dextrin, modified food starch, or cereal. Thickening, fillers, or emulsifiers. Malt flavoring, malt extract, or malt syrup. Hydrolyzed vegetable protein.  In the [place] packaged foods that are gluten-free are required to be labeled GF. These foods should be easy to identify and are safe to eat. In the [place] food companies are also required to list common food allergens, including wheat, on their labels. Recommended foods Grains Amaranth, bean flours, 100% buckwheat flour, corn, millet, nut flours or nut meals, GF oats, quinoa, rice, sorghum, teff, rice wafers, pure cornmeal tortillas, popcorn, and hot cereals made from cornmeal. Hominy, rice, wild rice. Some Asian rice noodles or bean noodles. Arrowroot starch, corn bran, corn flour, corn germ, cornmeal, corn starch, potato flour, potato starch flour, and rice bran. Plain, brown, and sweet rice flours. Rice polish, soy flour, and tapioca starch. Vegetables All plain fresh, frozen, and canned vegetables. Fruits All plain fresh, frozen, canned, and dried fruits, and 100% fruit juices. Meats and other protein foods All fresh beef, pork, poultry, fish, seafood, and eggs. Fish canned in water, oil, brine, or vegetable broth. Plain nuts and seeds, peanut butter. Some lunch meat and some frankfurters. Dried beans, dried peas, and lentils. Dairy Fresh plain, dry, evaporated, or condensed milk. Cream, butter, sour cream, whipping cream, and most yogurts. Unprocessed cheese, most processed cheeses, some cottage cheese, some cream cheeses. Beverages Coffee, tea, most herbal teas. Carbonated beverages and some root beers. Wine, sake, and distilled spirits, such as gin, vodka, and whiskey. Most hard ciders. Fats and oils Butter,  margarine, vegetable oil, hydrogenated butter, olive oil, shortening, lard, cream, and some  mayonnaise. Some commercial salad dressings. Olives. Sweets and desserts Sugar, honey, some syrups, molasses, jelly, and jam. Plain hard candy, marshmallows, and gumdrops. Pure cocoa powder. Plain chocolate. Custard and some pudding mixes. Gelatin desserts, sorbets, frozen ice pops, and sherbet. Cake, cookies, and other desserts prepared with allowed flours. Some commercial ice creams. Cornstarch, tapioca, and rice puddings. Seasoning and other foods Some canned or frozen soups. Monosodium glutamate (MSG). Cider, rice, and wine vinegar. Baking soda and baking powder. Cream of tartar. Baking and nutritional yeast. Certain soy sauces made without wheat (ask your dietitian about specific brands that are allowed). Nuts, coconut, and chocolate. Salt, pepper, herbs, spices, flavoring extracts, imitation or artificial flavorings, natural flavorings, and food colorings. Some medicines and supplements. Some lip glosses and other cosmetics. Rice syrups. The items listed may not be a complete list. Talk with your dietitian about what dietary choices are best for you. Foods to avoid Grains Barley, bran, bulgur, couscous, cracked wheat, [place], farro, graham, malt, matzo, semolina, wheat germ, and all wheat and rye cereals including spelt and kamut. Cereals containing malt as a flavoring, such as rice cereal. Noodles, spaghetti, macaroni, most packaged rice mixes, and all mixes containing wheat, rye, barley, or triticale. Vegetables Most creamed vegetables and most vegetables canned in sauces. Some commercially prepared vegetables and salads. Fruits Thickened or prepared fruits and some pie fillings. Some fruit snacks and fruit roll-ups. Meats and other protein foods Any meat or meat alternative containing wheat, rye, barley, or gluten stabilizers. These are often marinated or packaged meats and lunch meats. Bread-containing products, such as Swiss steak, croquettes, meatballs, and meatloaf. Most tuna canned in  vegetable broth and [place] with hydrolyzed vegetable protein (HVP) injected as part of the basting. Seitan. Imitation fish. Eggs in sauces made from ingredients to avoid. Dairy Commercial chocolate milk drinks and malted milk. Some non-dairy creamers. Any cheese product containing ingredients to avoid. Beverages Certain cereal beverages. Beer, ale, malted milk, and some root beers. Some hard ciders. Some instant flavored coffees. Some herbal teas made with barley or with barley malt added. Fats and oils Some commercial salad dressings. Sour cream containing modified food starch. Sweets and desserts Some toffees. Chocolate-coated nuts (may be rolled in wheat flour) and some commercial candies and candy bars. Most cakes, cookies, donuts, pastries, and other baked goods. Some commercial ice cream. Ice cream cones. Commercially prepared mixes for cakes, cookies, and other desserts. Bread pudding and other puddings thickened with flour. Products containing brown rice syrup made with barley malt enzyme. Desserts and sweets made with malt flavoring. Seasoning and other foods Some curry powders, some dry seasoning mixes, some gravy extracts, some meat sauces, some ketchups, some prepared mustards, and horseradish. Certain soy sauces. Malt vinegar. Bouillon and bouillon cubes that contain HVP. Some chip dips, and some chewing gum. Yeast extract. Brewers yeast. Caramel color. Some medicines and supplements. Some lip glosses and other cosmetics. The items listed may not be a complete list. Talk with your dietitian about what dietary choices are best for you. Summary Gluten is a protein that is found in wheat, rye, barley, and some other grains. The gluten-free diet includes all foods that do not contain gluten. If you need help finding gluten-free foods or if you have questions, talk with your diet and nutrition specialist (registered dietitian) or your health care provider. Read all food labels. Gluten is  often added to foods. Always check the ingredient  list and look for warnings, such as may contain gluten." This information is not intended to replace advice given to you by your health care provider. Make sure you discuss any questions you have with your health care provider. Document Released: 03/15/2005 Document Revised: 12/29/2015 Document Reviewed: 12/29/2015 Elsevier Interactive Patient Education  2018 [employer].

## 2021-06-04 NOTE — Progress Notes (Signed)
? ?Subjective:  ?Patient ID: Teresa Hubbard, female    DOB: June 24, 1960  Age: 61 y.o. MRN: [id number] ? ?CC: No chief complaint on file. ? ? ?HPI ?Teresa Hubbard presents for multiple joints aches, pains >1 year, stiffness. ?Mom has RA (?) ?C/o epigastric burning ? ? ?Outpatient Medications Prior to Visit  ?Medication Sig Dispense Refill  ? Cholecalciferol (VITAMIN D3) 50 MCG (2000 UT) capsule Take 1 capsule (2,000 Units total) by mouth daily. 100 capsule 3  ? Cyanocobalamin (VITAMIN B-12) 1000 MCG SUBL Place 0.5 tablets (500 mcg total) under the tongue daily. 100 tablet 3  ? Probiotic Product (ALIGN) 4 MG CAPS Take 1 capsule (4 mg total) by mouth daily. 30 capsule 1  ? Atorvastatin Calcium (LIPITOR PO) Lipitor    ? metroNIDAZOLE (FLAGYL) 500 MG tablet Take 1 tablet (500 mg total) by mouth 3 (three) times daily. 30 tablet 0  ? ?No facility-administered medications prior to visit.  ? ? ?ROS: ?Review of Systems  ?Constitutional:  Negative for activity change, appetite change, chills, fatigue and unexpected weight change.  ?HENT:  Negative for congestion, mouth sores and sinus pressure.   ?Eyes:  Negative for visual disturbance.  ?Respiratory:  Negative for cough and chest tightness.   ?Gastrointestinal:  Negative for abdominal pain and nausea.  ?Genitourinary:  Negative for difficulty urinating, frequency and vaginal pain.  ?Musculoskeletal:  Negative for back pain and gait problem.  ?Skin:  Negative for pallor and rash.  ?Neurological:  Negative for dizziness, tremors, weakness, numbness and headaches.  ?Psychiatric/Behavioral:  Negative for confusion and sleep disturbance.   ? ?Objective:  ?BP 138/80 (BP Location: Left Arm, Patient Position: Sitting, Cuff Size: Large)   Pulse (!) 55   Temp 98.4 ?F (36.9 ?C) (Oral)   Ht 5' 7"  (1.702 m)   Wt 198 lb (89.8 kg)   SpO2 91%   BMI 31.01 kg/m?  ? ?BP Readings from Last 3 Encounters:  ?06/04/21 138/80  ?01/28/21 126/82  ?12/17/20 116/76  ? ? ?Wt Readings from Last 3  Encounters:  ?06/04/21 198 lb (89.8 kg)  ?01/28/21 197 lb (89.4 kg)  ?12/17/20 205 lb (93 kg)  ? ? ?Physical Exam ? ?Lab Results  ?Component Value Date  ? WBC 3.1 (L) 02/02/2021  ? HGB 13.8 02/02/2021  ? HCT 40.9 02/02/2021  ? PLT 245.0 02/02/2021  ? GLUCOSE 100 (H) 02/02/2021  ? CHOL 312 (H) 02/02/2021  ? TRIG 174.0 (H) 02/02/2021  ? HDL 50.10 02/02/2021  ? LDLDIRECT 243.0 12/31/2019  ? LDLCALC 227 (H) 02/02/2021  ? ALT 22 02/02/2021  ? AST 17 02/02/2021  ? NA 142 02/02/2021  ? K 4.5 02/02/2021  ? CL 107 02/02/2021  ? CREATININE 0.90 02/02/2021  ? BUN 14 02/02/2021  ? CO2 28 02/02/2021  ? TSH 3.32 02/02/2021  ? PSA 0.00 (L) 01/22/2011  ? HGBA1C 5.4 08/23/2018  ? ? ?VAS US CAROTID ? ?Result Date: 02/06/2019 ?Carotid Arterial Duplex Study Indications:  Bilateral bruits. No cerebrovascular symptoms. Risk Factors: No history of smoking. Performing Technologist: Mariane Masters RVT  Examination Guidelines: A complete evaluation includes B-mode imaging, spectral Doppler, color Doppler, and power Doppler as needed of all accessible portions of each vessel. Bilateral testing is considered an integral part of a complete examination. Limited examinations for reoccurring indications may be performed as noted.  Right Carotid Findings: +----------+--------+--------+--------+------------------+------------------+           PSV cm/sEDV cm/sStenosisPlaque DescriptionComments           +----------+--------+--------+--------+------------------+------------------+  CCA Prox  58      13                                                   +----------+--------+--------+--------+------------------+------------------+ CCA Distal90      23                                intimal thickening +----------+--------+--------+--------+------------------+------------------+ ICA Prox  74      19                                                   +----------+--------+--------+--------+------------------+------------------+  ICA Mid   62      25                                                   +----------+--------+--------+--------+------------------+------------------+ ICA Distal85      38                                                   +----------+--------+--------+--------+------------------+------------------+ ECA       109     18                                                   +----------+--------+--------+--------+------------------+------------------+ +----------+--------+-------+----------------+-------------------+           PSV cm/sEDV cmsDescribe        Arm Pressure (mmHG) +----------+--------+-------+----------------+-------------------+ [id number]            Multiphasic, [id number]                 +----------+--------+-------+----------------+-------------------+ +---------+--------+--+--------+--+---------+ VertebralPSV cm/s44EDV cm/s14Antegrade +---------+--------+--+--------+--+---------+  Left Carotid Findings: +----------+--------+--------+--------+------------------+------------------+           PSV cm/sEDV cm/sStenosisPlaque DescriptionComments           +----------+--------+--------+--------+------------------+------------------+ CCA Prox  117     30                                                   +----------+--------+--------+--------+------------------+------------------+ CCA Distal99      26                                intimal thickening +----------+--------+--------+--------+------------------+------------------+ ICA Prox  72      20                                                   +----------+--------+--------+--------+------------------+------------------+  ICA Mid   40      16                                tortuous           +----------+--------+--------+--------+------------------+------------------+ ICA Distal48      19                                                    +----------+--------+--------+--------+------------------+------------------+ ECA       110     30                                                   +----------+--------+--------+--------+------------------+------------------+ +----------+--------+--------+----------------+-------------------+           PSV cm/sEDV cm/sDescribe        Arm Pressure (mmHG) +----------+--------+--------+----------------+-------------------+ [id number]             Multiphasic, [id number]                 +----------+--------+--------+----------------+-------------------+ +---------+--------+--+--------+--+---------+ VertebralPSV cm/s41EDV cm/s14Antegrade +---------+--------+--+--------+--+---------+ The mid/distal ICA takes a deep dive. Summary: Right Carotid: There is no evidence of stenosis in the right ICA. The                extracranial vessels were near-normal with only minimal wall                thickening or plaque. Left Carotid: There is no evidence of stenosis in the left ICA. The extracranial               vessels were near-normal with only minimal wall thickening or               plaque. Vertebrals:  Bilateral vertebral arteries demonstrate antegrade flow. Subclavians: Normal flow hemodynamics were seen in bilateral subclavian              arteries. *See table(s) above for measurements and observations.  Electronically signed by Carlyle Dolly MD on 02/06/2019 at 11:40:01 AM.    Final   ? ? ?Assessment & Plan:  ? ?Problem List Items Addressed This Visit   ? ? Arthralgia  ?  Worse ?Off Lipitor now ?Start Vascepa ?Gluten free trial ?RF, uric acid and ESR - normal ?  ?  ? Diverticulosis of colon  ?  GI  - Dr Ardis Hughs ?  ?  ? GERD (gastroesophageal reflux disease)  ?  Worse 2022 ?Pepcid po 40 mg ?See Dr Ardis Hughs ?  ?  ? Relevant Medications  ? famotidine (PEPCID) 40 MG tablet  ? History of colonic polyps  ?  Dr Ardis Hughs. Last colon 11/2020 ?  ?  ?  ? ? ?Meds ordered this encounter  ?Medications  ? icosapent  Ethyl (VASCEPA) 1 g capsule  ?  Sig: Take 2 capsules (2 g total) by mouth 2 (two) times daily.  ?  Dispense:  360 capsule  ?  Refill:  3  ? famotidine (PEPCID) 40 MG tablet  ?  Sig: Take 1 tablet (40 mg total) by mouth daily.  ?  Dispense:  90 tablet  ?  Refill:  3  ?  ? ? ?  Follow-up: Return in about 3 months (around 09/04/2021) for a follow-up visit. ? ?Walker Kehr, MD ?

## 2021-06-04 NOTE — Assessment & Plan Note (Signed)
Dr Ardis Hughs. Last colon 11/2020 ?

## 2021-06-04 NOTE — Assessment & Plan Note (Signed)
Worse 2022 ?Pepcid po 40 mg ?See Dr Ardis Hughs ?

## 2021-06-04 NOTE — Assessment & Plan Note (Signed)
Worse ?Off Lipitor now ?Start Vascepa ?Gluten free trial ?RF, uric acid and ESR - normal ?

## 2021-07-02 ENCOUNTER — Telehealth: Payer: Self-pay | Admitting: Gastroenterology

## 2021-07-02 ENCOUNTER — Ambulatory Visit (INDEPENDENT_AMBULATORY_CARE_PROVIDER_SITE_OTHER): Payer: 59 | Admitting: Internal Medicine

## 2021-07-02 ENCOUNTER — Encounter: Payer: Self-pay | Admitting: Internal Medicine

## 2021-07-02 DIAGNOSIS — K5790 Diverticulosis of intestine, part unspecified, without perforation or abscess without bleeding: Secondary | ICD-10-CM | POA: Diagnosis not present

## 2021-07-02 DIAGNOSIS — E785 Hyperlipidemia, unspecified: Secondary | ICD-10-CM

## 2021-07-02 DIAGNOSIS — K5732 Diverticulitis of large intestine without perforation or abscess without bleeding: Secondary | ICD-10-CM

## 2021-07-02 DIAGNOSIS — M25561 Pain in right knee: Secondary | ICD-10-CM | POA: Diagnosis not present

## 2021-07-02 DIAGNOSIS — R1084 Generalized abdominal pain: Secondary | ICD-10-CM

## 2021-07-02 MED ORDER — HYDROCODONE-ACETAMINOPHEN 5-325 MG PO TABS: 1.0000 | ORAL_TABLET | ORAL | 0 refills | Status: AC | PRN

## 2021-07-02 MED ORDER — METRONIDAZOLE 500 MG PO TABS: 500.0000 mg | ORAL_TABLET | Freq: Three times a day (TID) | ORAL | 1 refills | Status: DC

## 2021-07-02 MED ORDER — CIPROFLOXACIN HCL 500 MG PO TABS: 500.0000 mg | ORAL_TABLET | Freq: Two times a day (BID) | ORAL | 1 refills | Status: DC

## 2021-07-02 NOTE — Telephone Encounter (Signed)
The pt has been advised that she will need an appt to discuss EGD with Dr Ardis Hughs. The pt will call back to make appt  ?

## 2021-07-02 NOTE — Assessment & Plan Note (Signed)
Rx Cipro and Flagyl ?

## 2021-07-02 NOTE — Telephone Encounter (Signed)
She tells me that physician told her to schedule an EGD. I offered an OV to discuss but she feels that physician already told her this is what she needs and might not be necessary. I assured her that if it has been awhile, she will definitely need to be seen first. She would like to confirm this before scheduling an appointment. ?

## 2021-07-02 NOTE — Progress Notes (Signed)
? ?Subjective:  ?Patient ID: Teresa Hubbard, female    DOB: [birth year]  Age: 61 y.o. MRN: [id number] ? ?CC: No chief complaint on file. ? ? ?HPI ?Teresa Hubbard presents for LLQ pain x 2 days, mild loose stools. Pain was 10/10 for a few hours ? ?F/u arthralgias on statins ? ?Outpatient Medications Prior to Visit  ?Medication Sig Dispense Refill  ? Cholecalciferol (VITAMIN D3) 50 MCG (2000 UT) capsule Take 1 capsule (2,000 Units total) by mouth daily. 100 capsule 3  ? Cyanocobalamin (VITAMIN B-12) 1000 MCG SUBL Place 0.5 tablets (500 mcg total) under the tongue daily. 100 tablet 3  ? famotidine (PEPCID) 40 MG tablet Take 1 tablet (40 mg total) by mouth daily. 90 tablet 3  ? icosapent Ethyl (VASCEPA) 1 g capsule Take 2 capsules (2 g total) by mouth 2 (two) times daily. 360 capsule 3  ? Probiotic Product (ALIGN) 4 MG CAPS Take 1 capsule (4 mg total) by mouth daily. 30 capsule 1  ? ?No facility-administered medications prior to visit.  ? ? ?ROS: ?Review of Systems ? ?Objective:  ?BP 100/68 (BP Location: Left Arm, Patient Position: Sitting, Cuff Size: Large)   Pulse 62   Temp 98.4 ?F (36.9 ?C) (Oral)   Ht '5\' 7"'$  (1.702 m)   Wt 198 lb (89.8 kg)   SpO2 98%   BMI 31.01 kg/m?  ? ?BP Readings from Last 3 Encounters:  ?07/02/21 100/68  ?06/04/21 138/80  ?01/28/21 126/82  ? ? ?Wt Readings from Last 3 Encounters:  ?07/02/21 198 lb (89.8 kg)  ?06/04/21 198 lb (89.8 kg)  ?01/28/21 197 lb (89.4 kg)  ? ? ?Physical Exam ? ?Lab Results  ?Component Value Date  ? WBC 3.1 (L) 02/02/2021  ? HGB 13.8 02/02/2021  ? HCT 40.9 02/02/2021  ? PLT 245.0 02/02/2021  ? GLUCOSE 100 (H) 02/02/2021  ? CHOL 312 (H) 02/02/2021  ? TRIG 174.0 (H) 02/02/2021  ? HDL 50.10 02/02/2021  ? LDLDIRECT 243.0 12/31/2019  ? LDLCALC 227 (H) 02/02/2021  ? ALT 22 02/02/2021  ? AST 17 02/02/2021  ? NA 142 02/02/2021  ? K 4.5 02/02/2021  ? CL 107 02/02/2021  ? CREATININE 0.90 02/02/2021  ? BUN 14 02/02/2021  ? CO2 28 02/02/2021  ? TSH 3.32 02/02/2021  ? PSA 0.00 (L)  01/22/2011  ? HGBA1C 5.4 08/23/2018  ? ? ?VAS US CAROTID ? ?Result Date: 02/06/2019 ?Carotid Arterial Duplex Study Indications:  Bilateral bruits. No cerebrovascular symptoms. Risk Factors: No history of smoking. Performing Technologist: Mariane Masters RVT  Examination Guidelines: A complete evaluation includes B-mode imaging, spectral Doppler, color Doppler, and power Doppler as needed of all accessible portions of each vessel. Bilateral testing is considered an integral part of a complete examination. Limited examinations for reoccurring indications may be performed as noted.  Right Carotid Findings: +----------+--------+--------+--------+------------------+------------------+           PSV cm/sEDV cm/sStenosisPlaque DescriptionComments           +----------+--------+--------+--------+------------------+------------------+ CCA Prox  58      13                                                   +----------+--------+--------+--------+------------------+------------------+ CCA Distal90      23  intimal thickening +----------+--------+--------+--------+------------------+------------------+ ICA Prox  74      19                                                   +----------+--------+--------+--------+------------------+------------------+ ICA Mid   62      25                                                   +----------+--------+--------+--------+------------------+------------------+ ICA Distal85      38                                                   +----------+--------+--------+--------+------------------+------------------+ ECA       109     18                                                   +----------+--------+--------+--------+------------------+------------------+ +----------+--------+-------+----------------+-------------------+           PSV cm/sEDV cmsDescribe        Arm Pressure (mmHG)  +----------+--------+-------+----------------+-------------------+ [id number]            Multiphasic, [id number]                 +----------+--------+-------+----------------+-------------------+ +---------+--------+--+--------+--+---------+ VertebralPSV cm/s44EDV cm/s14Antegrade +---------+--------+--+--------+--+---------+  Left Carotid Findings: +----------+--------+--------+--------+------------------+------------------+           PSV cm/sEDV cm/sStenosisPlaque DescriptionComments           +----------+--------+--------+--------+------------------+------------------+ CCA Prox  117     30                                                   +----------+--------+--------+--------+------------------+------------------+ CCA Distal99      26                                intimal thickening +----------+--------+--------+--------+------------------+------------------+ ICA Prox  72      20                                                   +----------+--------+--------+--------+------------------+------------------+ ICA Mid   40      16                                tortuous           +----------+--------+--------+--------+------------------+------------------+ ICA Distal48      19                                                   +----------+--------+--------+--------+------------------+------------------+  ECA       110     30                                                   +----------+--------+--------+--------+------------------+------------------+ +----------+--------+--------+----------------+-------------------+           PSV cm/sEDV cm/sDescribe        Arm Pressure (mmHG) +----------+--------+--------+----------------+-------------------+ [id number]             Multiphasic, [id number]                 +----------+--------+--------+----------------+-------------------+ +---------+--------+--+--------+--+---------+ VertebralPSV cm/s41EDV  cm/s14Antegrade +---------+--------+--+--------+--+---------+ The mid/distal ICA takes a deep dive. Summary: Right Carotid: There is no evidence of stenosis in the right ICA. The                extracranial vessels were near-normal with only minimal wall                thickening or plaque. Left Carotid: There is no evidence of stenosis in the left ICA. The extracranial               vessels were near-normal with only minimal wall thickening or               plaque. Vertebrals:  Bilateral vertebral arteries demonstrate antegrade flow. Subclavians: Normal flow hemodynamics were seen in bilateral subclavian              arteries. *See table(s) above for measurements and observations.  Electronically signed by Carlyle Dolly MD on 02/06/2019 at 11:40:01 AM.    Final   ? ? ?Assessment & Plan:  ? ?Problem List Items Addressed This Visit   ? ? Dyslipidemia  ?  Statin intolerant ?On Vascepa ?  ?  ? Abdominal pain  ?  LLQ ?Rx Cipro and Flagyl ?Norco prn ? Potential benefits of a short term opioids use as well as potential risks (i.e. addiction risk, apnea etc) and complications (i.e. Somnolence, constipation and others) were explained to the patient and were aknowledged. ?Appt w/Dr Ardis Hughs is pending ?  ?  ? Diverticulitis of large intestine without perforation or abscess without bleeding  ?  New attack #4 ?Rx Cipro and Flagyl ?Norco prn ? Potential benefits of a short term opioids use as well as potential risks (i.e. addiction risk, apnea etc) and complications (i.e. Somnolence, constipation and others) were explained to the patient and were aknowledged. ?Appt w/Dr Ardis Hughs is pending ?  ?  ? Relevant Medications  ? metroNIDAZOLE (FLAGYL) 500 MG tablet  ? ciprofloxacin (CIPRO) 500 MG tablet  ? HYDROcodone-acetaminophen (NORCO/VICODIN) 5-325 MG tablet  ? Arthralgia  ?  Resolved off statins ?Statin intolerant ?On Vascepa ?  ?  ? Diverticular disease  ?  Rx Cipro and Flagyl ?  ?  ?  ? ? ?Meds ordered this encounter   ?Medications  ? metroNIDAZOLE (FLAGYL) 500 MG tablet  ?  Sig: Take 1 tablet (500 mg total) by mouth 3 (three) times daily for 10 days.  ?  Dispense:  21 tablet  ?  Refill:  1  ? ciprofloxacin (CIPRO) 500 MG tablet  ?  Sig: Take 1 tablet (500 mg total) by mouth 2 (two) times daily for 10 days.  ?  Dispense:  20 tablet  ?  Refill:  1  ? HYDROcodone-acetaminophen (NORCO/VICODIN) 5-325 MG t

## 2021-07-02 NOTE — Assessment & Plan Note (Signed)
LLQ ?Rx Cipro and Flagyl ?Norco prn ? Potential benefits of a short term opioids use as well as potential risks (i.e. addiction risk, apnea etc) and complications (i.e. Somnolence, constipation and others) were explained to the patient and were aknowledged. ?Appt w/Dr Ardis Hughs is pending ?

## 2021-07-02 NOTE — Assessment & Plan Note (Addendum)
Resolved off statins ?Statin intolerant ?On Vascepa ?

## 2021-07-02 NOTE — Assessment & Plan Note (Signed)
Statin intolerant On Vascepa 

## 2021-07-02 NOTE — Assessment & Plan Note (Addendum)
New attack #4 ?Rx Cipro and Flagyl ?Norco prn ? Potential benefits of a short term opioids use as well as potential risks (i.e. addiction risk, apnea etc) and complications (i.e. Somnolence, constipation and others) were explained to the patient and were aknowledged. ?Appt w/Dr Ardis Hughs is pending ?

## 2021-07-29 ENCOUNTER — Encounter: Payer: Self-pay | Admitting: Internal Medicine

## 2021-07-29 ENCOUNTER — Ambulatory Visit (INDEPENDENT_AMBULATORY_CARE_PROVIDER_SITE_OTHER): Payer: 59 | Admitting: Internal Medicine

## 2021-07-29 VITALS — BP 130/70 | HR 62 | Temp 97.9°F | Ht 67.0 in | Wt 195.0 lb

## 2021-07-29 DIAGNOSIS — K641 Second degree hemorrhoids: Secondary | ICD-10-CM | POA: Diagnosis not present

## 2021-07-29 DIAGNOSIS — K5792 Diverticulitis of intestine, part unspecified, without perforation or abscess without bleeding: Secondary | ICD-10-CM | POA: Diagnosis not present

## 2021-07-29 DIAGNOSIS — R1032 Left lower quadrant pain: Secondary | ICD-10-CM | POA: Diagnosis not present

## 2021-07-29 LAB — COMPREHENSIVE METABOLIC PANEL
ALT: 43 U/L — ABNORMAL HIGH (ref 0–35)
AST: 18 U/L (ref 0–37)
Albumin: 4.3 g/dL (ref 3.5–5.2)
Alkaline Phosphatase: 44 U/L (ref 39–117)
BUN: 13 mg/dL (ref 6–23)
CO2: 29 mEq/L (ref 19–32)
Calcium: 9.6 mg/dL (ref 8.4–10.5)
Chloride: 104 mEq/L (ref 96–112)
Creatinine, Ser: 0.81 mg/dL (ref 0.40–1.20)
GFR: 79.08 mL/min (ref >60.00)
Glucose, Bld: 96 mg/dL (ref 70–99)
Potassium: 4.7 mEq/L (ref 3.5–5.1)
Sodium: 138 mEq/L (ref 135–145)
Total Bilirubin: 0.6 mg/dL (ref 0.2–1.2)
Total Protein: 7.4 g/dL (ref 6.0–8.3)

## 2021-07-29 LAB — CBC WITH DIFFERENTIAL/PLATELET
Basophils Absolute: 0.1 10*3/uL (ref 0.0–0.1)
Basophils Relative: 1.2 % (ref 0.0–3.0)
Eosinophils Absolute: 0.2 10*3/uL (ref 0.0–0.7)
Eosinophils Relative: 2.8 % (ref 0.0–5.0)
HCT: 41.2 % (ref 36.0–46.0)
Hemoglobin: 13.8 g/dL (ref 12.0–15.0)
Lymphocytes Relative: 23.5 % (ref 12.0–46.0)
Lymphs Abs: 1.8 10*3/uL (ref 0.7–4.0)
MCHC: 33.4 g/dL (ref 30.0–36.0)
MCV: 89.5 fl (ref 78.0–100.0)
Monocytes Absolute: 0.4 10*3/uL (ref 0.1–1.0)
Monocytes Relative: 5.4 % (ref 3.0–12.0)
Neutro Abs: 5.3 10*3/uL (ref 1.4–7.7)
Neutrophils Relative %: 67.1 % (ref 43.0–77.0)
Platelets: 240 10*3/uL (ref 150.0–400.0)
RBC: 4.61 Mil/uL (ref 3.87–5.11)
RDW: 13.2 % (ref 11.5–15.5)
WBC: 7.9 10*3/uL (ref 4.0–10.5)

## 2021-07-29 MED ORDER — METRONIDAZOLE 500 MG PO TABS: 500.0000 mg | ORAL_TABLET | Freq: Three times a day (TID) | ORAL | 1 refills | Status: DC

## 2021-07-29 MED ORDER — ONDANSETRON HCL 4 MG PO TABS: 4.0000 mg | ORAL_TABLET | Freq: Three times a day (TID) | ORAL | 0 refills | Status: DC | PRN

## 2021-07-29 MED ORDER — ALIGN 4 MG PO CAPS: 1.0000 | ORAL_CAPSULE | Freq: Every day | ORAL | 1 refills | Status: DC

## 2021-07-29 MED ORDER — HYDROCORTISONE (PERIANAL) 2.5 % EX CREA: 1.0000 "application " | TOPICAL_CREAM | Freq: Two times a day (BID) | CUTANEOUS | 1 refills | Status: DC

## 2021-07-29 MED ORDER — CIPROFLOXACIN HCL 500 MG PO TABS: 500.0000 mg | ORAL_TABLET | Freq: Two times a day (BID) | ORAL | 1 refills | Status: DC

## 2021-07-29 NOTE — Assessment & Plan Note (Signed)
Use Anusol HC cream ?

## 2021-07-29 NOTE — Assessment & Plan Note (Signed)
Recurrent attack #5 ?Falagyl and Cipro ?Align ?Zofran prn ?CBC, CMET ?CT abd/pelvis ordered ?

## 2021-07-29 NOTE — Progress Notes (Signed)
? ?Subjective:  ?Patient ID: Teresa Hubbard, female    DOB: 11-23-1960  Age: 61 y.o. MRN: [id number] ? ?CC: No chief complaint on file. ? ? ?HPI ?TRAEH MILROY presents for another episode of LLQ abd pain today - severe pain this AM, pt took [place] ? ?Outpatient Medications Prior to Visit  ?Medication Sig Dispense Refill  ? Cholecalciferol (VITAMIN D3) 50 MCG (2000 UT) capsule Take 1 capsule (2,000 Units total) by mouth daily. 100 capsule 3  ? Cyanocobalamin (VITAMIN B-12) 1000 MCG SUBL Place 0.5 tablets (500 mcg total) under the tongue daily. 100 tablet 3  ? famotidine (PEPCID) 40 MG tablet Take 1 tablet (40 mg total) by mouth daily. 90 tablet 3  ? HYDROcodone-acetaminophen (NORCO/VICODIN) 5-325 MG tablet Take 1 tablet by mouth every 4 (four) hours as needed for severe pain. 20 tablet 0  ? icosapent Ethyl (VASCEPA) 1 g capsule Take 2 capsules (2 g total) by mouth 2 (two) times daily. 360 capsule 3  ? Probiotic Product (ALIGN) 4 MG CAPS Take 1 capsule (4 mg total) by mouth daily. 30 capsule 1  ? ?No facility-administered medications prior to visit.  ? ? ?ROS: ?Review of Systems  ?Constitutional:  Negative for activity change, appetite change, chills, fatigue and unexpected weight change.  ?HENT:  Negative for congestion, mouth sores and sinus pressure.   ?Eyes:  Negative for visual disturbance.  ?Respiratory:  Negative for cough and chest tightness.   ?Gastrointestinal:  Positive for abdominal pain and nausea. Negative for blood in stool, constipation, diarrhea and vomiting.  ?Genitourinary:  Negative for difficulty urinating, frequency and vaginal pain.  ?Musculoskeletal:  Negative for back pain and gait problem.  ?Skin:  Negative for pallor and rash.  ?Neurological:  Negative for dizziness, tremors, weakness, numbness and headaches.  ?Psychiatric/Behavioral:  Negative for confusion and sleep disturbance.   ? ?Objective:  ?BP 130/70 (BP Location: Left Arm, Patient Position: Sitting, Cuff Size: Normal)   Pulse 62    Temp 97.9 ?F (36.6 ?C) (Oral)   Ht '5\' 7"'$  (1.702 m)   Wt 195 lb (88.5 kg)   SpO2 99%   BMI 30.54 kg/m?  ? ?BP Readings from Last 3 Encounters:  ?07/29/21 130/70  ?07/02/21 100/68  ?06/04/21 138/80  ? ? ?Wt Readings from Last 3 Encounters:  ?07/29/21 195 lb (88.5 kg)  ?07/02/21 198 lb (89.8 kg)  ?06/04/21 198 lb (89.8 kg)  ? ? ?Physical Exam ?Constitutional:   ?   General: She is not in acute distress. ?   Appearance: She is well-developed.  ?HENT:  ?   Head: Normocephalic.  ?   Right Ear: External ear normal.  ?   Left Ear: External ear normal.  ?   Nose: Nose normal.  ?Eyes:  ?   General:     ?   Right eye: No discharge.     ?   Left eye: No discharge.  ?   Conjunctiva/sclera: Conjunctivae normal.  ?   Pupils: Pupils are equal, round, and reactive to light.  ?Neck:  ?   Thyroid: No thyromegaly.  ?   Vascular: No JVD.  ?   Trachea: No tracheal deviation.  ?Cardiovascular:  ?   Rate and Rhythm: Normal rate and regular rhythm.  ?   Heart sounds: Normal heart sounds.  ?Pulmonary:  ?   Effort: No respiratory distress.  ?   Breath sounds: No stridor. No wheezing.  ?Abdominal:  ?   General: Bowel sounds are normal. There is  no distension.  ?   Palpations: Abdomen is soft. There is no mass.  ?   Tenderness: There is no abdominal tenderness. There is no guarding or rebound.  ?Musculoskeletal:     ?   General: No tenderness.  ?   Cervical back: Normal range of motion and neck supple. No rigidity.  ?Lymphadenopathy:  ?   Cervical: No cervical adenopathy.  ?Skin: ?   Findings: No erythema or rash.  ?Neurological:  ?   Mental Status: She is oriented to person, place, and time.  ?   Cranial Nerves: No cranial nerve deficit.  ?   Motor: No abnormal muscle tone.  ?   Coordination: Coordination normal.  ?   Deep Tendon Reflexes: Reflexes normal.  ?Psychiatric:     ?   Behavior: Behavior normal.     ?   Thought Content: Thought content normal.     ?   Judgment: Judgment normal.  ?LLQ w/pain ?No mass or rebound ? ?Lab Results   ?Component Value Date  ? WBC 3.1 (L) 02/02/2021  ? HGB 13.8 02/02/2021  ? HCT 40.9 02/02/2021  ? PLT 245.0 02/02/2021  ? GLUCOSE 100 (H) 02/02/2021  ? CHOL 312 (H) 02/02/2021  ? TRIG 174.0 (H) 02/02/2021  ? HDL 50.10 02/02/2021  ? LDLDIRECT 243.0 12/31/2019  ? LDLCALC 227 (H) 02/02/2021  ? ALT 22 02/02/2021  ? AST 17 02/02/2021  ? NA 142 02/02/2021  ? K 4.5 02/02/2021  ? CL 107 02/02/2021  ? CREATININE 0.90 02/02/2021  ? BUN 14 02/02/2021  ? CO2 28 02/02/2021  ? TSH 3.32 02/02/2021  ? PSA 0.00 (L) 01/22/2011  ? HGBA1C 5.4 08/23/2018  ? ? ?VAS US CAROTID ? ?Result Date: 02/06/2019 ?Carotid Arterial Duplex Study Indications:  Bilateral bruits. No cerebrovascular symptoms. Risk Factors: No history of smoking. Performing Technologist: Mariane Masters RVT  Examination Guidelines: A complete evaluation includes B-mode imaging, spectral Doppler, color Doppler, and power Doppler as needed of all accessible portions of each vessel. Bilateral testing is considered an integral part of a complete examination. Limited examinations for reoccurring indications may be performed as noted.  Right Carotid Findings: +----------+--------+--------+--------+------------------+------------------+           PSV cm/sEDV cm/sStenosisPlaque DescriptionComments           +----------+--------+--------+--------+------------------+------------------+ CCA Prox  58      13                                                   +----------+--------+--------+--------+------------------+------------------+ CCA Distal90      23                                intimal thickening +----------+--------+--------+--------+------------------+------------------+ ICA Prox  74      19                                                   +----------+--------+--------+--------+------------------+------------------+ ICA Mid   62      25                                                    +----------+--------+--------+--------+------------------+------------------+  ICA Distal85      38                                                   +----------+--------+--------+--------+------------------+------------------+ ECA       109     18                                                   +----------+--------+--------+--------+------------------+------------------+ +----------+--------+-------+----------------+-------------------+           PSV cm/sEDV cmsDescribe        Arm Pressure (mmHG) +----------+--------+-------+----------------+-------------------+ [id number]            Multiphasic, [id number]                 +----------+--------+-------+----------------+-------------------+ +---------+--------+--+--------+--+---------+ VertebralPSV cm/s44EDV cm/s14Antegrade +---------+--------+--+--------+--+---------+  Left Carotid Findings: +----------+--------+--------+--------+------------------+------------------+           PSV cm/sEDV cm/sStenosisPlaque DescriptionComments           +----------+--------+--------+--------+------------------+------------------+ CCA Prox  117     30                                                   +----------+--------+--------+--------+------------------+------------------+ CCA Distal99      26                                intimal thickening +----------+--------+--------+--------+------------------+------------------+ ICA Prox  72      20                                                   +----------+--------+--------+--------+------------------+------------------+ ICA Mid   40      16                                tortuous           +----------+--------+--------+--------+------------------+------------------+ ICA Distal48      19                                                   +----------+--------+--------+--------+------------------+------------------+ ECA       110     30                                                    +----------+--------+--------+--------+------------------+------------------+ +----------+--------+--------+----------------+-------------------+           PSV cm/sEDV cm/sDescribe        Arm Pressure (mmHG) +----------+--------+--------+----------------+-------------------+ [id number]             Multiphasic, [id number]                 +----------+--

## 2021-08-03 ENCOUNTER — Telehealth: Payer: Self-pay | Admitting: Internal Medicine

## 2021-08-06 ENCOUNTER — Ambulatory Visit
Admission: RE | Admit: 2021-08-06 | Discharge: 2021-08-06 | Disposition: A | Discharge status: Home / Self Care | Payer: 59 | Source: Ambulatory Visit | Attending: Internal Medicine | Admitting: Internal Medicine

## 2021-08-06 DIAGNOSIS — R1032 Left lower quadrant pain: Secondary | ICD-10-CM

## 2021-08-06 MED ORDER — IOPAMIDOL (ISOVUE-300) INJECTION 61%
100.0000 mL | Freq: Once | INTRAVENOUS | Status: AC | PRN
  Administered 2021-08-06: 100 mL via INTRAVENOUS

## 2021-08-08 ENCOUNTER — Encounter: Payer: Self-pay | Admitting: Internal Medicine

## 2021-08-08 ENCOUNTER — Telehealth: Payer: Self-pay | Admitting: Internal Medicine

## 2021-08-08 NOTE — Telephone Encounter (Signed)
Received call from nurse access about  ct scan report   done yesterday  but called as critical result  over 24 later  ,  diverticulitis . ? ?Nursing was to contact about how she is doing .and triage as indicated  ?Called  x 1 NA left message . ?Record states she has been rx with Cipro and flagyl recurrent diverticulitis .  ? ? ? ?1. Acute diverticulitis involving the proximal sigmoid colon. 3.4 cm ?gas-filled area near the inflamed colon consistent with large ?gas-filled diverticulum versus contained perforation. No evidence of ?fluid collection or drainable abscess at this time. ?2. Uterine fibroid. ?3.  Aortic Atherosclerosis (ICD10-I70.0). ?  ?These results will be called to the ordering clinician or ?representative by the Radiologist Assistant, and communication ?documented in the PACS or [employer]. ?  ?  ?Electronically Signed ?  By: Randa Ngo M.D. ?  On: 08/08/2021 16:28 ?  ?

## 2021-08-09 ENCOUNTER — Other Ambulatory Visit: Payer: Self-pay | Admitting: Internal Medicine

## 2021-08-09 DIAGNOSIS — R10814 Left lower quadrant abdominal tenderness: Secondary | ICD-10-CM

## 2021-08-09 DIAGNOSIS — K5732 Diverticulitis of large intestine without perforation or abscess without bleeding: Secondary | ICD-10-CM

## 2021-08-11 NOTE — Telephone Encounter (Signed)
Noted.  Addressed.  Thanks ?

## 2021-08-13 ENCOUNTER — Telehealth: Payer: Self-pay | Admitting: Internal Medicine

## 2021-08-13 NOTE — Telephone Encounter (Signed)
Connected to [facility] and Lab reported 5.13.2023.  CT abdomen and pelvis - acute diverticuilitis completed on 08/07/21 at 10:30am

## 2021-08-25 ENCOUNTER — Encounter: Payer: Self-pay | Admitting: Gastroenterology

## 2021-08-25 ENCOUNTER — Ambulatory Visit (INDEPENDENT_AMBULATORY_CARE_PROVIDER_SITE_OTHER): Payer: 59 | Admitting: Gastroenterology

## 2021-08-25 VITALS — BP 134/82 | HR 62 | Ht 67.0 in | Wt 194.1 lb

## 2021-08-25 DIAGNOSIS — K5732 Diverticulitis of large intestine without perforation or abscess without bleeding: Secondary | ICD-10-CM

## 2021-08-25 NOTE — Patient Instructions (Signed)
If you are age 60 or younger, your body mass index should be between 19-25. Your Body mass index is 30.4 kg/m. If this is out of the aformentioned range listed, please consider follow up with your Primary Care Provider.  ________________________________________________________  The [address] [facility] providers would like to encourage you to use [facility] to communicate with providers for non-urgent requests or questions.  Due to long hold times on the telephone, sending your provider a message by [facility] may be a faster and more efficient way to get a response.  Please allow 48 business hours for a response.  Please remember that this is for non-urgent requests.  _______________________________________________________  Dennis Bast have been scheduled for an appointment with ______ at [facility]. Your appointment is on _____ at _____. Please arrive at _____ for registration. Make certain to bring a list of current medications, including any over the counter medications or vitamins. Also bring your co-pay if you have one as well as your insurance cards. [facility] is located at [address]. Should you need to reschedule your appointment, please contact them at [phone].  Thank you for entrusting me with your care and choosing [facility].  Dr Ardis Hughs

## 2021-08-25 NOTE — Progress Notes (Signed)
Review of pertinent gastrointestinal problems: 1. History of adenomatous colon polyp:  Colonoscopy 2011 found 3 small hyperplastic polyps as well as pan diverticulosis.  She was recommended to have repeat colonoscopy at 10 year interval.  Colonoscopy 06/2016 for minor rectal bleeding (likely hemorrhoidal) found internal hemorrhoids, left sided diverticulosis, 55m polyp (TA by pathology): recommended to have surveillance colonoscopy at 5 year interval.  Cologuard test, I believe ordered by her primary care physician, was negative for April 2022.  This was not an appropriate test since she has a history of adenomatous polyps.  Colonoscopy 11/2020 Dr. JArdis Hughssingle subcentimeter adenoma removed.  Biopsies from the rest of the colon showed no sign of microscopic colitis.  Left-sided diverticulosis 2. Diverticulosis, confirmed by colonoscopy 2011. 3.  Recurrent diverticulitis, hepatic flexure August 2013 confirmed by CT scan; this was mild, treated quite easily with oral antibiotics.  Clinically she had had diverticulitis in the sigmoid colon once or twice previously. No CT scan proof of this however.  CT scan 07/2021 "acute diverticulitis involving the proximal sigmoid colon" 4.  Family history of colon cancer, her mother was diagnosed when she was about 89years old 571  GERD without alarm symptoms, office visit 10/2020 started over-the-counter strength proton pump inhibitor once daily   HPI: This is a very pleasant 61year old woman  She had left lower quadrant pain in April, clinically diagnosed as diverticulitis.  She was put on antibiotics and her symptoms improved.  About 1 month later she had another episode of left lower quadrant pains and had a CT scan, see that below.  She was again put on oral antibiotics.  She feels absolutely fine now.  She had no fevers or chills with either these events.  CT scan abdomen pelvis with IV and oral contrast 07/2021, indication left lower quadrant pain. There is  diffusecolonic diverticulosis, which short segment wall thickening and pericolonic fat stranding within the proximal sigmoid colon consistent with acute diverticulitis. 3.4 cm rounded gas filled area in the region of the inflamed colon could reflect a large gas-filled diverticulum versus contained perforation. No fluid collection or abscess at this time. There is no free intraperitoneal gas.  Blood work 07/2021 CBC was normal, complete metabolic profile was normal except for slightly elevated ALT at 43  She again mentioned her GERD.  She has no alarm symptoms.  While taking over-the-counter strength proton pump inhibitor her symptoms are completely absolutely resolved.  She stopped taking them.  ROS: complete GI ROS as described in HPI, all other review negative.  Constitutional:  No unintentional weight loss   Past Medical History:  Diagnosis Date   B12 DEFICIENCY 08/18/2007   DIVERTICULOSIS, COLON 02/17/2007   High cholesterol    Joint pain    Lack of energy    Weight gain     History reviewed. No pertinent surgical history.  Current Outpatient Medications  Medication Instructions   Align 4 mg, Oral, Daily   famotidine (PEPCID) 40 mg, Oral, Daily   HYDROcodone-acetaminophen (NORCO/VICODIN) 5-325 MG tablet 1 tablet, Oral, Every 4 hours PRN   hydrocortisone (ANUSOL-HC) 2.5 % rectal cream 1 application., Rectal, 2 times daily   icosapent Ethyl (VASCEPA) 2 g, Oral, 2 times daily   ondansetron (ZOFRAN) 4 mg, Oral, Every 8 hours PRN   Vitamin B-12 500 mcg, Sublingual, Daily   Vitamin D3 2,000 Units, Oral, Daily    Allergies as of 08/25/2021 - Review Complete 08/25/2021  Allergen Reaction Noted   Atorvastatin  01/31/2019  Family History  Problem Relation Age of Onset   Cancer Mother    High blood pressure Mother    Colon cancer Mother    High blood pressure Father    High Cholesterol Father    Ovarian cancer Maternal Aunt    Lung cancer Maternal Uncle    Esophageal  cancer Neg Hx    Rectal cancer Neg Hx    Stomach cancer Neg Hx     Social History   Socioeconomic History   Marital status: Married    Spouse name: Trilby Leaver   Number of children: 2   Years of education: Not on file   Highest education level: Not on file  Occupational History   Occupation: Armed forces operational officer: [employer]  Tobacco Use   Smoking status: Never   Smokeless tobacco: Never  Vaping Use   Vaping Use: Never used  Substance and Sexual Activity   Alcohol use: Yes    Comment: wine occasional   Drug use: No   Sexual activity: Yes  Other Topics Concern   Not on file  Social History Narrative   Not on file   Social Determinants of Health   Financial Resource Strain: Not on file  Food Insecurity: Not on file  Transportation Needs: Not on file  Physical Activity: Not on file  Stress: Not on file  Social Connections: Not on file  Intimate Partner Violence: Not on file     Physical Exam: BP 134/82   Pulse 62   Ht '5\' 7"'$  (1.702 m)   Wt 194 lb 2 oz (88.1 kg)   BMI 30.40 kg/m  Constitutional: generally well-appearing Psychiatric: alert and oriented x3 Abdomen: soft, nontender, nondistended, no obvious ascites, no peritoneal signs, normal bowel sounds No peripheral edema noted in lower extremities  Assessment and plan: 61 y.o. female with recurrent diverticulitis, GERD without alarm symptoms  Seems like she is having repeat episodes of left-sided diverticulitis.  She had a colonoscopy about 1 year ago and I do not think that needs to be repeated now.  She would like to sit down with a surgeon to consider elective segmental colon resection to reduce the chances of any further events.  We will refer her to [facility] surgery colorectal surgeon.  She does have heartburn, 0 alarm symptoms.  As long as she takes over-the-counter strength proton pump inhibitor once daily she feels completely fine and her GERD symptoms are completely resolved.  I explained to her  that she should continue taking that medicine and it is safe for her to be on it indefinitely.  She has 0 alarm symptoms, no need for endoscopic evaluation.  Please see the "Patient Instructions" section for addition details about the plan.  Owens Loffler, MD [facility] 08/25/2021, 8:59 AM   Total time on date of encounter was 25 minutes (this included time spent preparing to see the patient reviewing records; obtaining and/or reviewing separately obtained history; performing a medically appropriate exam and/or evaluation; counseling and educating the patient and family if present; ordering medications, tests or procedures if applicable; and documenting clinical information in the health record).

## 2021-08-26 ENCOUNTER — Other Ambulatory Visit: Payer: 59

## 2021-08-27 ENCOUNTER — Telehealth: Payer: Self-pay | Admitting: Internal Medicine

## 2021-08-27 NOTE — Telephone Encounter (Signed)
Patient would like a referral to gastroenterologist - Carol Ada - [facility] - Phone #  [phone]

## 2021-08-28 NOTE — Telephone Encounter (Signed)
Fraidy just saw Dr. Ardis Hughs.  The next step is to see  a Education officer, environmental.  I do not think seeing Dr. Benson Norway would contribute any at this point. Thanks,

## 2021-08-31 ENCOUNTER — Telehealth: Payer: Self-pay | Admitting: Internal Medicine

## 2021-08-31 NOTE — Telephone Encounter (Signed)
Patient would still like to be referred to Dr. Benson Norway - gastroenterologist

## 2021-09-01 NOTE — Telephone Encounter (Signed)
Called pt inform her of Dr. Alain Marion advise. Pt states she will go ahead to see the surgeon...Teresa Hubbard

## 2021-09-21 ENCOUNTER — Other Ambulatory Visit: Payer: Self-pay | Admitting: Internal Medicine

## 2021-09-21 ENCOUNTER — Ambulatory Visit (INDEPENDENT_AMBULATORY_CARE_PROVIDER_SITE_OTHER): Payer: 59 | Admitting: Internal Medicine

## 2021-09-21 ENCOUNTER — Encounter: Payer: Self-pay | Admitting: Internal Medicine

## 2021-09-21 DIAGNOSIS — E559 Vitamin D deficiency, unspecified: Secondary | ICD-10-CM

## 2021-09-21 DIAGNOSIS — M25559 Pain in unspecified hip: Secondary | ICD-10-CM | POA: Insufficient documentation

## 2021-09-21 DIAGNOSIS — M7062 Trochanteric bursitis, left hip: Secondary | ICD-10-CM

## 2021-09-21 DIAGNOSIS — E538 Deficiency of other specified B group vitamins: Secondary | ICD-10-CM | POA: Diagnosis not present

## 2021-09-21 DIAGNOSIS — M25561 Pain in right knee: Secondary | ICD-10-CM

## 2021-09-21 DIAGNOSIS — K5732 Diverticulitis of large intestine without perforation or abscess without bleeding: Secondary | ICD-10-CM

## 2021-09-21 DIAGNOSIS — M25552 Pain in left hip: Secondary | ICD-10-CM | POA: Diagnosis not present

## 2021-09-21 DIAGNOSIS — K5730 Diverticulosis of large intestine without perforation or abscess without bleeding: Secondary | ICD-10-CM | POA: Diagnosis not present

## 2021-09-21 DIAGNOSIS — M7072 Other bursitis of hip, left hip: Secondary | ICD-10-CM | POA: Insufficient documentation

## 2021-09-21 MED ORDER — CIPROFLOXACIN HCL 500 MG PO TABS: 500.0000 mg | ORAL_TABLET | Freq: Two times a day (BID) | ORAL | 1 refills | Status: DC

## 2021-09-21 MED ORDER — METRONIDAZOLE 500 MG PO TABS: 500.0000 mg | ORAL_TABLET | Freq: Three times a day (TID) | ORAL | 1 refills | Status: DC

## 2021-09-21 NOTE — Progress Notes (Signed)
Subjective:  Patient ID: Teresa Hubbard, female    DOB: [birth year]  Age: 61 y.o. MRN: [id number]  CC: No chief complaint on file.   HPI [name] presents for diverticulitis. Aleda saw a Psychologist, sport and exercise - OK not to have surgery.  Follow-up on the hip pain, vitamin B12 and vitamin D deficiency  Outpatient Medications Prior to Visit  Medication Sig Dispense Refill   Cholecalciferol (VITAMIN D3) 50 MCG (2000 UT) capsule Take 1 capsule (2,000 Units total) by mouth daily. 100 capsule 3   Cyanocobalamin (VITAMIN B-12) 1000 MCG SUBL Place 0.5 tablets (500 mcg total) under the tongue daily. 100 tablet 3   famotidine (PEPCID) 40 MG tablet Take 1 tablet (40 mg total) by mouth daily. 90 tablet 3   HYDROcodone-acetaminophen (NORCO/VICODIN) 5-325 MG tablet Take 1 tablet by mouth every 4 (four) hours as needed for severe pain. 20 tablet 0   hydrocortisone (ANUSOL-HC) 2.5 % rectal cream Place 1 application. rectally 2 (two) times daily. 60 g 1   icosapent Ethyl (VASCEPA) 1 g capsule Take 2 capsules (2 g total) by mouth 2 (two) times daily. 360 capsule 3   ondansetron (ZOFRAN) 4 MG tablet Take 1 tablet (4 mg total) by mouth every 8 (eight) hours as needed for nausea or vomiting. 20 tablet 0   Probiotic Product (ALIGN) 4 MG CAPS Take 1 capsule (4 mg total) by mouth daily. 30 capsule 1   No facility-administered medications prior to visit.    ROS: Review of Systems  Constitutional:  Negative for activity change, appetite change, chills, fatigue and unexpected weight change.  HENT:  Negative for congestion, mouth sores and sinus pressure.   Eyes:  Negative for visual disturbance.  Respiratory:  Negative for cough and chest tightness.   Gastrointestinal:  Negative for abdominal pain and nausea.  Genitourinary:  Negative for difficulty urinating, frequency and vaginal pain.  Musculoskeletal:  Negative for back pain and gait problem.  Skin:  Negative for pallor and rash.  Neurological:  Negative for dizziness,  tremors, weakness, numbness and headaches.  Psychiatric/Behavioral:  Negative for confusion, sleep disturbance and suicidal ideas. The patient is not nervous/anxious.     Objective:  BP 130/70 (BP Location: Left Arm, Patient Position: Sitting, Cuff Size: Large)   Pulse 62   Temp 97.8 F (36.6 C) (Oral)   Ht '5\' 7"'$  (1.702 m)   Wt 192 lb (87.1 kg)   SpO2 98%   BMI 30.07 kg/m   BP Readings from Last 3 Encounters:  09/21/21 130/70  08/25/21 134/82  07/29/21 130/70    Wt Readings from Last 3 Encounters:  09/21/21 192 lb (87.1 kg)  08/25/21 194 lb 2 oz (88.1 kg)  07/29/21 195 lb (88.5 kg)    Physical Exam Constitutional:      General: She is not in acute distress.    Appearance: She is well-developed. She is obese.  HENT:     Head: Normocephalic.     Right Ear: External ear normal.     Left Ear: External ear normal.     Nose: Nose normal.  Eyes:     General:        Right eye: No discharge.        Left eye: No discharge.     Conjunctiva/sclera: Conjunctivae normal.     Pupils: Pupils are equal, round, and reactive to light.  Neck:     Thyroid: No thyromegaly.     Vascular: No JVD.     Trachea:  No tracheal deviation.  Cardiovascular:     Rate and Rhythm: Normal rate and regular rhythm.     Heart sounds: Normal heart sounds.  Pulmonary:     Effort: No respiratory distress.     Breath sounds: No stridor. No wheezing.  Abdominal:     General: Bowel sounds are normal. There is no distension.     Palpations: Abdomen is soft. There is no mass.     Tenderness: There is no abdominal tenderness. There is no guarding or rebound.  Musculoskeletal:        General: No tenderness.     Cervical back: Normal range of motion and neck supple. No rigidity.  Lymphadenopathy:     Cervical: No cervical adenopathy.  Skin:    Findings: No erythema or rash.  Neurological:     Mental Status: She is oriented to person, place, and time.     Cranial Nerves: No cranial nerve deficit.      Motor: No abnormal muscle tone.     Coordination: Coordination normal.     Deep Tendon Reflexes: Reflexes normal.  Psychiatric:        Mood and Affect: Mood normal.        Behavior: Behavior normal.        Thought Content: Thought content normal.        Judgment: Judgment normal.     Lab Results  Component Value Date   WBC 7.9 07/29/2021   HGB 13.8 07/29/2021   HCT 41.2 07/29/2021   PLT 240.0 07/29/2021   GLUCOSE 96 07/29/2021   CHOL 312 (H) 02/02/2021   TRIG 174.0 (H) 02/02/2021   HDL 50.10 02/02/2021   LDLDIRECT 243.0 12/31/2019   LDLCALC 227 (H) 02/02/2021   ALT 43 (H) 07/29/2021   AST 18 07/29/2021   NA 138 07/29/2021   K 4.7 07/29/2021   CL 104 07/29/2021   CREATININE 0.81 07/29/2021   BUN 13 07/29/2021   CO2 29 07/29/2021   TSH 3.32 02/02/2021   PSA 0.00 (L) 01/22/2011   HGBA1C 5.4 08/23/2018    CT Abdomen Pelvis W Contrast  Result Date: 08/08/2021 CLINICAL DATA:  Left lower quadrant abdominal pain, diverticulitis, symptoms for 1.5 weeks EXAM: CT ABDOMEN AND PELVIS WITH CONTRAST TECHNIQUE: Multidetector CT imaging of the abdomen and pelvis was performed using the standard protocol following bolus administration of intravenous contrast. RADIATION DOSE REDUCTION: This exam was performed according to the departmental dose-optimization program which includes automated exposure control, adjustment of the mA and/or kV according to patient size and/or use of iterative reconstruction technique. CONTRAST:  1109m ISOVUE-300 IOPAMIDOL (ISOVUE-300) INJECTION 61% COMPARISON:  10/30/2011 FINDINGS: Lower chest: No acute pleural or parenchymal lung disease. Hepatobiliary: No focal liver abnormality is seen. No gallstones, gallbladder wall thickening, or biliary dilatation. Pancreas: Unremarkable. No pancreatic ductal dilatation or surrounding inflammatory changes. Spleen: Normal in size without focal abnormality. Adrenals/Urinary Tract: Adrenal glands are unremarkable. Kidneys are normal,  without renal calculi, focal lesion, or hydronephrosis. Bladder is unremarkable. Stomach/Bowel: No bowel obstruction or ileus. There is diffuse colonic diverticulosis, which short segment wall thickening and pericolonic fat stranding within the proximal sigmoid colon consistent with acute diverticulitis. 3.4 cm rounded gas filled area in the region of the inflamed colon could reflect a large gas-filled diverticulum versus contained perforation. No fluid collection or abscess at this time. There is no free intraperitoneal gas. Normal appendix right lower quadrant. Vascular/Lymphatic: Aortic atherosclerosis. No enlarged abdominal or pelvic lymph nodes. Reproductive: 3.2 cm fibroid within the  uterine fundus. No adnexal masses. Other: No free intraperitoneal fluid or free intraperitoneal gas. No abdominal wall hernia. Musculoskeletal: No acute or destructive bony lesions. Reconstructed images demonstrate no additional findings. IMPRESSION: 1. Acute diverticulitis involving the proximal sigmoid colon. 3.4 cm gas-filled area near the inflamed colon consistent with large gas-filled diverticulum versus contained perforation. No evidence of fluid collection or drainable abscess at this time. 2. Uterine fibroid. 3.  Aortic Atherosclerosis (ICD10-I70.0). These results will be called to the ordering clinician or representative by the Radiologist Assistant, and communication documented in the PACS or [employer]. Electronically Signed   By: Randa Ngo M.D.   On: 08/08/2021 16:28    Assessment & Plan:   Problem List Items Addressed This Visit     Arthralgia    Blue-Emu cream was recommended to use 2-3 times a day       B12 deficiency    On B12      Diverticulitis of large intestine without perforation or abscess without bleeding    Rx given Cipro and Flagyl      Relevant Medications   ciprofloxacin (CIPRO) 500 MG tablet   Diverticulosis of colon    Take Florastor, metamucil GI  - Dr Ardis Hughs d/c;  2023 - Dr Benson Norway Per Dr STECHSCHULTE:  Ms. Christenbury has had multiple episodes of diverticulitis and presents today to discuss possible elective sigmoid colectomy. I explained how treatment of diverticulitis with elective sigmoid colectomy is a personalized discussion. I think since her episodes have all been mild and treatable with antibiotics, and she is never required IR drain, or hospitalization for IV antibiotics, we should proceed with observation and avoid surgery at this time. We discussed how her future episodes of diverticulitis are likely to be similar to her previous episodes of diverticulitis. She has made multiple changes recently in her lifestyle which may also reduce her risk of diverticular episodes in the future including a increase in fiber in her diet and better control of her constipation symptoms. If she did decide in the future to have surgery, she is interested in seeing a colorectal partner in the group.  Return if symptoms worsen or fail to improve.  PAUL JEFFREY STECHSCHULTE, MD   Rx given Cipro and Flagyl      Relevant Orders   Ambulatory referral to Gastroenterology   Hip bursitis, left    ROM exercise      Hip pain    L hip bursitis Blue-Emu cream was recommended to use 2-3 times a day      Vitamin D deficiency    Cont w/Vit d         Meds ordered this encounter  Medications   ciprofloxacin (CIPRO) 500 MG tablet    Sig: Take 1 tablet (500 mg total) by mouth 2 (two) times daily.    Dispense:  20 tablet    Refill:  1   DISCONTD: metroNIDAZOLE (FLAGYL) 500 MG tablet    Sig: Take 1 tablet (500 mg total) by mouth 3 (three) times daily for 10 days.    Dispense:  21 tablet    Refill:  1      Follow-up: Return in about 6 months (around 03/23/2022) for Wellness Exam.  Walker Kehr, MD

## 2021-09-22 NOTE — Telephone Encounter (Signed)
Rec'd msg Script Clarification:THE QUANTITY WILL ONLY LAST 7 DAYS BUT YOU SAY TO TAKE FOR 10 DAYS. PLEASE SEND CORRECTED RX. THANKS, [facility] [id number]...Raechel Chute

## 2021-09-28 MED ORDER — METRONIDAZOLE 500 MG PO TABS: ORAL_TABLET | ORAL | 1 refills | Status: DC

## 2021-09-28 NOTE — Addendum Note (Signed)
Addended by: Earnstine Regal on: 09/28/2021 08:11 AM   Modules accepted: Orders

## 2021-09-28 NOTE — Telephone Encounter (Signed)
1st transmission failed to pharmacy this am by MD. Resent flagyl.Marland KitchenJohny Hubbard

## 2021-10-05 ENCOUNTER — Telehealth: Payer: Self-pay | Admitting: Internal Medicine

## 2021-10-05 NOTE — Telephone Encounter (Signed)
Pt request referral

## 2022-02-01 ENCOUNTER — Encounter: Payer: Self-pay | Admitting: Internal Medicine

## 2022-02-01 ENCOUNTER — Ambulatory Visit (INDEPENDENT_AMBULATORY_CARE_PROVIDER_SITE_OTHER): Payer: 59 | Admitting: Internal Medicine

## 2022-02-01 VITALS — BP 112/82 | HR 83 | Temp 98.3°F | Ht 67.0 in | Wt 199.4 lb

## 2022-02-01 DIAGNOSIS — Z23 Encounter for immunization: Secondary | ICD-10-CM | POA: Diagnosis not present

## 2022-02-01 DIAGNOSIS — M25561 Pain in right knee: Secondary | ICD-10-CM

## 2022-02-01 DIAGNOSIS — Z0000 Encounter for general adult medical examination without abnormal findings: Secondary | ICD-10-CM | POA: Diagnosis not present

## 2022-02-01 MED ORDER — ALIGN 4 MG PO CAPS: 1.0000 | ORAL_CAPSULE | Freq: Every day | ORAL | 5 refills | Status: AC

## 2022-02-01 MED ORDER — DICLOFENAC SODIUM 75 MG PO TBEC: 75.0000 mg | DELAYED_RELEASE_TABLET | Freq: Two times a day (BID) | ORAL | 2 refills | Status: DC | PRN

## 2022-02-01 NOTE — Addendum Note (Signed)
Addended by: Earnstine Regal on: 02/01/2022 03:37 PM   Modules accepted: Orders

## 2022-02-01 NOTE — Assessment & Plan Note (Signed)
We discussed age appropriate health related issues, including available/recomended screening tests and vaccinations. We discussed a need for adhering to healthy diet and exercise. Labs were ordered to be later reviewed . All questions were answered. 2020 Coronary calcium score 0

## 2022-02-01 NOTE — Progress Notes (Signed)
Subjective:  Patient ID: Teresa Hubbard, female    DOB: 1960-05-28  Age: 61 y.o. MRN: [id number]  CC: Annual Exam   HPI Teresa Hubbard presents for well exam  C/o daily 5-6/10 R side HAs 2-3 weeks after a trip to [place]  Outpatient Medications Prior to Visit  Medication Sig Dispense Refill   Cholecalciferol (VITAMIN D3) 50 MCG (2000 UT) capsule Take 1 capsule (2,000 Units total) by mouth daily. 100 capsule 3   Cyanocobalamin (VITAMIN B-12) 1000 MCG SUBL Place 0.5 tablets (500 mcg total) under the tongue daily. 100 tablet 3   HYDROcodone-acetaminophen (NORCO/VICODIN) 5-325 MG tablet Take 1 tablet by mouth every 4 (four) hours as needed for severe pain. 20 tablet 0   hydrocortisone (ANUSOL-HC) 2.5 % rectal cream Place 1 application. rectally 2 (two) times daily. 60 g 1   icosapent Ethyl (VASCEPA) 1 g capsule Take 2 capsules (2 g total) by mouth 2 (two) times daily. 360 capsule 3   ciprofloxacin (CIPRO) 500 MG tablet Take 1 tablet (500 mg total) by mouth 2 (two) times daily. (Patient not taking: Reported on 02/01/2022) 20 tablet 1   famotidine (PEPCID) 40 MG tablet Take 1 tablet (40 mg total) by mouth daily. (Patient not taking: Reported on 02/01/2022) 90 tablet 3   metroNIDAZOLE (FLAGYL) 500 MG tablet TAKE 1 TABLET BY MOUTH THREE TIMES A DAY FOR 10 DAYS (Patient not taking: Reported on 02/01/2022) 20 tablet 1   ondansetron (ZOFRAN) 4 MG tablet Take 1 tablet (4 mg total) by mouth every 8 (eight) hours as needed for nausea or vomiting. (Patient not taking: Reported on 02/01/2022) 20 tablet 0   Probiotic Product (ALIGN) 4 MG CAPS Take 1 capsule (4 mg total) by mouth daily. (Patient not taking: Reported on 02/01/2022) 30 capsule 1   No facility-administered medications prior to visit.    ROS: Review of Systems  Constitutional:  Negative for activity change, appetite change, chills, fatigue and unexpected weight change.  HENT:  Negative for congestion, mouth sores and sinus pressure.   Eyes:   Negative for visual disturbance.  Respiratory:  Negative for cough and chest tightness.   Gastrointestinal:  Negative for abdominal pain and nausea.  Genitourinary:  Negative for difficulty urinating, frequency and vaginal pain.  Musculoskeletal:  Negative for back pain and gait problem.  Skin:  Negative for pallor and rash.  Neurological:  Negative for dizziness, tremors, weakness, numbness and headaches.  Psychiatric/Behavioral:  Negative for confusion, sleep disturbance and suicidal ideas.     Objective:  BP 112/82 (BP Location: Left Arm)   Pulse 83   Temp 98.3 F (36.8 C) (Oral)   Ht '5\' 7"'$  (1.702 m)   Wt 199 lb 6.4 oz (90.4 kg)   SpO2 97%   BMI 31.23 kg/m   BP Readings from Last 3 Encounters:  02/01/22 112/82  09/21/21 130/70  08/25/21 134/82    Wt Readings from Last 3 Encounters:  02/01/22 199 lb 6.4 oz (90.4 kg)  09/21/21 192 lb (87.1 kg)  08/25/21 194 lb 2 oz (88.1 kg)    Physical Exam  Lab Results  Component Value Date   WBC 7.9 07/29/2021   HGB 13.8 07/29/2021   HCT 41.2 07/29/2021   PLT 240.0 07/29/2021   GLUCOSE 96 07/29/2021   CHOL 312 (H) 02/02/2021   TRIG 174.0 (H) 02/02/2021   HDL 50.10 02/02/2021   LDLDIRECT 243.0 12/31/2019   LDLCALC 227 (H) 02/02/2021   ALT 43 (H) 07/29/2021   AST  18 07/29/2021   NA 138 07/29/2021   K 4.7 07/29/2021   CL 104 07/29/2021   CREATININE 0.81 07/29/2021   BUN 13 07/29/2021   CO2 29 07/29/2021   TSH 3.32 02/02/2021   PSA 0.00 (L) 01/22/2011   HGBA1C 5.4 08/23/2018    CT Abdomen Pelvis W Contrast  Result Date: 08/08/2021 CLINICAL DATA:  Left lower quadrant abdominal pain, diverticulitis, symptoms for 1.5 weeks EXAM: CT ABDOMEN AND PELVIS WITH CONTRAST TECHNIQUE: Multidetector CT imaging of the abdomen and pelvis was performed using the standard protocol following bolus administration of intravenous contrast. RADIATION DOSE REDUCTION: This exam was performed according to the departmental dose-optimization program  which includes automated exposure control, adjustment of the mA and/or kV according to patient size and/or use of iterative reconstruction technique. CONTRAST:  152m ISOVUE-300 IOPAMIDOL (ISOVUE-300) INJECTION 61% COMPARISON:  10/30/2011 FINDINGS: Lower chest: No acute pleural or parenchymal lung disease. Hepatobiliary: No focal liver abnormality is seen. No gallstones, gallbladder wall thickening, or biliary dilatation. Pancreas: Unremarkable. No pancreatic ductal dilatation or surrounding inflammatory changes. Spleen: Normal in size without focal abnormality. Adrenals/Urinary Tract: Adrenal glands are unremarkable. Kidneys are normal, without renal calculi, focal lesion, or hydronephrosis. Bladder is unremarkable. Stomach/Bowel: No bowel obstruction or ileus. There is diffuse colonic diverticulosis, which short segment wall thickening and pericolonic fat stranding within the proximal sigmoid colon consistent with acute diverticulitis. 3.4 cm rounded gas filled area in the region of the inflamed colon could reflect a large gas-filled diverticulum versus contained perforation. No fluid collection or abscess at this time. There is no free intraperitoneal gas. Normal appendix right lower quadrant. Vascular/Lymphatic: Aortic atherosclerosis. No enlarged abdominal or pelvic lymph nodes. Reproductive: 3.2 cm fibroid within the uterine fundus. No adnexal masses. Other: No free intraperitoneal fluid or free intraperitoneal gas. No abdominal wall hernia. Musculoskeletal: No acute or destructive bony lesions. Reconstructed images demonstrate no additional findings. IMPRESSION: 1. Acute diverticulitis involving the proximal sigmoid colon. 3.4 cm gas-filled area near the inflamed colon consistent with large gas-filled diverticulum versus contained perforation. No evidence of fluid collection or drainable abscess at this time. 2. Uterine fibroid. 3.  Aortic Atherosclerosis (ICD10-I70.0). These results will be called to the  ordering clinician or representative by the Radiologist Assistant, and communication documented in the PACS or [employer] Electronically Signed   By: MRanda NgoM.D.   On: 08/08/2021 16:28    Assessment & Plan:   Problem List Items Addressed This Visit     Arthralgia   Relevant Orders   Rheumatoid factor   ANA   Well adult exam - Primary    We discussed age appropriate health related issues, including available/recomended screening tests and vaccinations. We discussed a need for adhering to healthy diet and exercise. Labs were ordered to be later reviewed . All questions were answered. 2020 Coronary calcium score 0      Relevant Orders   TSH   Urinalysis   CBC with Differential/Platelet   Lipid panel   Comprehensive metabolic panel      Meds ordered this encounter  Medications   diclofenac (VOLTAREN) 75 MG EC tablet    Sig: Take 1 tablet (75 mg total) by mouth 2 (two) times daily as needed for moderate pain.    Dispense:  60 tablet    Refill:  2       Follow-up: No follow-ups on file.  AWalker Kehr MD

## 2022-02-16 ENCOUNTER — Other Ambulatory Visit (INDEPENDENT_AMBULATORY_CARE_PROVIDER_SITE_OTHER): Payer: 59

## 2022-02-16 DIAGNOSIS — Z0000 Encounter for general adult medical examination without abnormal findings: Secondary | ICD-10-CM

## 2022-02-16 DIAGNOSIS — M25561 Pain in right knee: Secondary | ICD-10-CM

## 2022-02-16 LAB — COMPREHENSIVE METABOLIC PANEL
ALT: 45 U/L — ABNORMAL HIGH (ref 0–35)
AST: 28 U/L (ref 0–37)
Albumin: 4.2 g/dL (ref 3.5–5.2)
Alkaline Phosphatase: 33 U/L — ABNORMAL LOW (ref 39–117)
BUN: 16 mg/dL (ref 6–23)
CO2: 29 mEq/L (ref 19–32)
Calcium: 9.3 mg/dL (ref 8.4–10.5)
Chloride: 104 mEq/L (ref 96–112)
Creatinine, Ser: 0.97 mg/dL (ref 0.40–1.20)
GFR: 63.46 mL/min (ref >60.00)
Glucose, Bld: 95 mg/dL (ref 70–99)
Potassium: 4.2 mEq/L (ref 3.5–5.1)
Sodium: 140 mEq/L (ref 135–145)
Total Bilirubin: 0.4 mg/dL (ref 0.2–1.2)
Total Protein: 7.1 g/dL (ref 6.0–8.3)

## 2022-02-16 LAB — LIPID PANEL
Cholesterol: 319 mg/dL — ABNORMAL HIGH (ref 0–200)
HDL: 56.1 mg/dL (ref >39.00)
LDL Cholesterol: 230 mg/dL — ABNORMAL HIGH (ref 0–99)
NonHDL: 262.68
Total CHOL/HDL Ratio: 6
Triglycerides: 163 mg/dL — ABNORMAL HIGH (ref 0.0–149.0)
VLDL: 32.6 mg/dL (ref 0.0–40.0)

## 2022-02-16 LAB — CBC WITH DIFFERENTIAL/PLATELET
Basophils Absolute: 0.1 10*3/uL (ref 0.0–0.1)
Basophils Relative: 2.4 % (ref 0.0–3.0)
Eosinophils Absolute: 0.3 10*3/uL (ref 0.0–0.7)
Eosinophils Relative: 5.7 % — ABNORMAL HIGH (ref 0.0–5.0)
HCT: 41.6 % (ref 36.0–46.0)
Hemoglobin: 14.2 g/dL (ref 12.0–15.0)
Lymphocytes Relative: 50 % — ABNORMAL HIGH (ref 12.0–46.0)
Lymphs Abs: 2.2 10*3/uL (ref 0.7–4.0)
MCHC: 34.1 g/dL (ref 30.0–36.0)
MCV: 89.6 fl (ref 78.0–100.0)
Monocytes Absolute: 0.4 10*3/uL (ref 0.1–1.0)
Monocytes Relative: 7.8 % (ref 3.0–12.0)
Neutro Abs: 1.5 10*3/uL (ref 1.4–7.7)
Neutrophils Relative %: 34.1 % — ABNORMAL LOW (ref 43.0–77.0)
Platelets: 254 10*3/uL (ref 150.0–400.0)
RBC: 4.64 Mil/uL (ref 3.87–5.11)
RDW: 13.4 % (ref 11.5–15.5)
WBC: 4.5 10*3/uL (ref 4.0–10.5)

## 2022-02-16 LAB — URINALYSIS, ROUTINE W REFLEX MICROSCOPIC
Bilirubin Urine: NEGATIVE
Hgb urine dipstick: NEGATIVE
Ketones, ur: NEGATIVE
Nitrite: NEGATIVE
Specific Gravity, Urine: >=1.03 — AB (ref 1.000–1.030)
Total Protein, Urine: NEGATIVE
Urine Glucose: NEGATIVE
Urobilinogen, UA: 0.2 (ref 0.0–1.0)
pH: 6 (ref 5.0–8.0)

## 2022-02-16 LAB — TSH: TSH: 4.73 u[IU]/mL (ref 0.35–5.50)

## 2022-02-19 LAB — ANA: Anti Nuclear Antibody (ANA): POSITIVE — AB

## 2022-02-19 LAB — ANTI-NUCLEAR AB-TITER (ANA TITER): ANA Titer 1: 1:80 {titer} — ABNORMAL HIGH

## 2022-02-19 LAB — RHEUMATOID FACTOR: Rheumatoid fact SerPl-aCnc: <14 IU/mL (ref <14)

## 2022-02-21 ENCOUNTER — Other Ambulatory Visit: Payer: Self-pay | Admitting: Internal Medicine

## 2022-02-21 DIAGNOSIS — M25561 Pain in right knee: Secondary | ICD-10-CM

## 2022-02-21 DIAGNOSIS — E785 Hyperlipidemia, unspecified: Secondary | ICD-10-CM

## 2022-02-22 ENCOUNTER — Other Ambulatory Visit (INDEPENDENT_AMBULATORY_CARE_PROVIDER_SITE_OTHER): Payer: 59

## 2022-02-22 DIAGNOSIS — M25561 Pain in right knee: Secondary | ICD-10-CM

## 2022-02-22 DIAGNOSIS — E785 Hyperlipidemia, unspecified: Secondary | ICD-10-CM | POA: Diagnosis not present

## 2022-02-22 LAB — URINALYSIS
Bilirubin Urine: NEGATIVE
Hgb urine dipstick: NEGATIVE
Ketones, ur: NEGATIVE
Leukocytes,Ua: NEGATIVE
Nitrite: NEGATIVE
Specific Gravity, Urine: 1.025 (ref 1.000–1.030)
Total Protein, Urine: NEGATIVE
Urine Glucose: NEGATIVE
Urobilinogen, UA: 0.2 (ref 0.0–1.0)
pH: 5.5 (ref 5.0–8.0)

## 2022-02-22 LAB — LIPID PANEL
Cholesterol: 350 mg/dL — ABNORMAL HIGH (ref 0–200)
HDL: 55.8 mg/dL (ref >39.00)
LDL Cholesterol: 265 mg/dL — ABNORMAL HIGH (ref 0–99)
NonHDL: 294.66
Total CHOL/HDL Ratio: 6
Triglycerides: 148 mg/dL (ref 0.0–149.0)
VLDL: 29.6 mg/dL (ref 0.0–40.0)

## 2022-02-22 LAB — COMPREHENSIVE METABOLIC PANEL
ALT: 34 U/L (ref 0–35)
AST: 18 U/L (ref 0–37)
Albumin: 4.3 g/dL (ref 3.5–5.2)
Alkaline Phosphatase: 36 U/L — ABNORMAL LOW (ref 39–117)
BUN: 11 mg/dL (ref 6–23)
CO2: 29 mEq/L (ref 19–32)
Calcium: 9.5 mg/dL (ref 8.4–10.5)
Chloride: 101 mEq/L (ref 96–112)
Creatinine, Ser: 0.91 mg/dL (ref 0.40–1.20)
GFR: 68.5 mL/min (ref >60.00)
Glucose, Bld: 95 mg/dL (ref 70–99)
Potassium: 4.2 mEq/L (ref 3.5–5.1)
Sodium: 137 mEq/L (ref 135–145)
Total Bilirubin: 0.4 mg/dL (ref 0.2–1.2)
Total Protein: 7.3 g/dL (ref 6.0–8.3)

## 2022-02-23 ENCOUNTER — Telehealth: Payer: Self-pay | Admitting: Internal Medicine

## 2022-02-23 LAB — ANA MULTIPLEX, WITH REFLEX TO DSDNA: ANACHOICE (R) SCREEN: POSITIVE — AB

## 2022-02-23 LAB — ANTI-DNA ANTIBODY, DOUBLE-STRANDED: ds DNA Ab: <1 IU/mL

## 2022-02-23 NOTE — Telephone Encounter (Signed)
Patient said that Dr. Alain Marion wants her to have a lupus blood test but one has not been ordered - please call patient when this is done so she can some back in for labs.

## 2022-02-24 NOTE — Telephone Encounter (Signed)
Comments are attached to lab work.  Thanks

## 2022-02-24 NOTE — Telephone Encounter (Signed)
Called pt gave her MD response on labs...  Dear Teresa Hubbard, Your dsDNA test is normal which makes the diagnosis of lupus unlikely-good news!  Your lipids remain elevated.  No change in plans. Sincerely, .Marland KitchenMarland Kitchen

## 2022-03-08 ENCOUNTER — Encounter: Payer: Self-pay | Admitting: Internal Medicine

## 2022-03-08 ENCOUNTER — Ambulatory Visit (INDEPENDENT_AMBULATORY_CARE_PROVIDER_SITE_OTHER): Payer: 59 | Admitting: Internal Medicine

## 2022-03-08 VITALS — BP 120/80 | HR 63 | Temp 97.6°F | Ht 67.0 in | Wt 201.0 lb

## 2022-03-08 DIAGNOSIS — L249 Irritant contact dermatitis, unspecified cause: Secondary | ICD-10-CM

## 2022-03-08 DIAGNOSIS — N3090 Cystitis, unspecified without hematuria: Secondary | ICD-10-CM

## 2022-03-08 DIAGNOSIS — E538 Deficiency of other specified B group vitamins: Secondary | ICD-10-CM

## 2022-03-08 DIAGNOSIS — L259 Unspecified contact dermatitis, unspecified cause: Secondary | ICD-10-CM | POA: Insufficient documentation

## 2022-03-08 DIAGNOSIS — E785 Hyperlipidemia, unspecified: Secondary | ICD-10-CM | POA: Diagnosis not present

## 2022-03-08 DIAGNOSIS — M25561 Pain in right knee: Secondary | ICD-10-CM

## 2022-03-08 MED ORDER — CIPROFLOXACIN HCL 250 MG PO TABS: 250.0000 mg | ORAL_TABLET | Freq: Two times a day (BID) | ORAL | 0 refills | Status: AC

## 2022-03-08 MED ORDER — TRIAMCINOLONE ACETONIDE 0.1 % EX OINT: 1.0000 | TOPICAL_OINTMENT | Freq: Three times a day (TID) | CUTANEOUS | 1 refills | Status: AC | PRN

## 2022-03-08 NOTE — Assessment & Plan Note (Addendum)
Chronic arthralgias.  Rheumatology consult

## 2022-03-08 NOTE — Assessment & Plan Note (Signed)
On B12 

## 2022-03-08 NOTE — Assessment & Plan Note (Signed)
Statin intolerant On Vascepa

## 2022-03-08 NOTE — Progress Notes (Signed)
Subjective:  Patient ID: Teresa Hubbard, female    DOB: November 28, 1960  Age: 61 y.o. MRN: [id number]  CC: Follow-up (Frequency with urine and can't hold it.)   HPI Teresa Hubbard presents for rash around eyes C/o frequent urination x 1 week F/u arthritis F/u dyslipidemia - worse  Outpatient Medications Prior to Visit  Medication Sig Dispense Refill   Cholecalciferol (VITAMIN D3) 50 MCG (2000 UT) capsule Take 1 capsule (2,000 Units total) by mouth daily. 100 capsule 3   Cyanocobalamin (VITAMIN B-12) 1000 MCG SUBL Place 0.5 tablets (500 mcg total) under the tongue daily. 100 tablet 3   diclofenac (VOLTAREN) 75 MG EC tablet Take 1 tablet (75 mg total) by mouth 2 (two) times daily as needed for moderate pain. 60 tablet 2   HYDROcodone-acetaminophen (NORCO/VICODIN) 5-325 MG tablet Take 1 tablet by mouth every 4 (four) hours as needed for severe pain. 20 tablet 0   hydrocortisone (ANUSOL-HC) 2.5 % rectal cream Place 1 application. rectally 2 (two) times daily. 60 g 1   icosapent Ethyl (VASCEPA) 1 g capsule Take 2 capsules (2 g total) by mouth 2 (two) times daily. 360 capsule 3   Probiotic Product (ALIGN) 4 MG CAPS Take 1 capsule (4 mg total) by mouth daily. 30 capsule 5   No facility-administered medications prior to visit.    ROS: Review of Systems  Constitutional:  Positive for fatigue. Negative for activity change, appetite change, chills and unexpected weight change.  HENT:  Negative for congestion, mouth sores and sinus pressure.   Eyes:  Negative for visual disturbance.  Respiratory:  Negative for cough and chest tightness.   Gastrointestinal:  Negative for abdominal pain and nausea.  Genitourinary:  Negative for difficulty urinating, frequency and vaginal pain.  Musculoskeletal:  Positive for arthralgias. Negative for back pain and gait problem.  Skin:  Negative for pallor and rash.  Neurological:  Negative for dizziness, tremors, weakness, numbness and headaches.   Psychiatric/Behavioral:  Negative for confusion and sleep disturbance.     Objective:  BP 120/80 (BP Location: Left Arm, Patient Position: Sitting, Cuff Size: Normal)   Pulse 63   Temp 97.6 F (36.4 C) (Oral)   Ht '5\' 7"'$  (1.702 m)   Wt 201 lb (91.2 kg)   SpO2 99%   BMI 31.48 kg/m   BP Readings from Last 3 Encounters:  03/08/22 120/80  02/01/22 112/82  09/21/21 130/70    Wt Readings from Last 3 Encounters:  03/08/22 201 lb (91.2 kg)  02/01/22 199 lb 6.4 oz (90.4 kg)  09/21/21 192 lb (87.1 kg)    Physical Exam Constitutional:      General: She is not in acute distress.    Appearance: She is well-developed. She is obese.  HENT:     Head: Normocephalic.     Right Ear: External ear normal.     Left Ear: External ear normal.     Nose: Nose normal.  Eyes:     General:        Right eye: No discharge.        Left eye: No discharge.     Conjunctiva/sclera: Conjunctivae normal.     Pupils: Pupils are equal, round, and reactive to light.  Neck:     Thyroid: No thyromegaly.     Vascular: No JVD.     Trachea: No tracheal deviation.  Cardiovascular:     Rate and Rhythm: Normal rate and regular rhythm.     Heart sounds: Normal heart sounds.  Pulmonary:     Effort: No respiratory distress.     Breath sounds: No stridor. No wheezing.  Abdominal:     General: Bowel sounds are normal. There is no distension.     Palpations: Abdomen is soft. There is no mass.     Tenderness: There is no abdominal tenderness. There is no guarding or rebound.  Musculoskeletal:        General: No tenderness.     Cervical back: Normal range of motion and neck supple. No rigidity.  Lymphadenopathy:     Cervical: No cervical adenopathy.  Skin:    Findings: No erythema or rash.  Neurological:     Mental Status: She is oriented to person, place, and time.     Cranial Nerves: No cranial nerve deficit.     Motor: No abnormal muscle tone.     Coordination: Coordination normal.     Deep Tendon  Reflexes: Reflexes normal.  Psychiatric:        Behavior: Behavior normal.        Thought Content: Thought content normal.        Judgment: Judgment normal.     Lab Results  Component Value Date   WBC 4.5 02/16/2022   HGB 14.2 02/16/2022   HCT 41.6 02/16/2022   PLT 254.0 02/16/2022   GLUCOSE 95 02/22/2022   CHOL 350 (H) 02/22/2022   TRIG 148.0 02/22/2022   HDL 55.80 02/22/2022   LDLDIRECT 243.0 12/31/2019   LDLCALC 265 (H) 02/22/2022   ALT 34 02/22/2022   AST 18 02/22/2022   NA 137 02/22/2022   K 4.2 02/22/2022   CL 101 02/22/2022   CREATININE 0.91 02/22/2022   BUN 11 02/22/2022   CO2 29 02/22/2022   TSH 4.73 02/16/2022   PSA 0.00 (L) 01/22/2011   HGBA1C 5.4 08/23/2018    CT Abdomen Pelvis W Contrast  Result Date: 08/08/2021 CLINICAL DATA:  Left lower quadrant abdominal pain, diverticulitis, symptoms for 1.5 weeks EXAM: CT ABDOMEN AND PELVIS WITH CONTRAST TECHNIQUE: Multidetector CT imaging of the abdomen and pelvis was performed using the standard protocol following bolus administration of intravenous contrast. RADIATION DOSE REDUCTION: This exam was performed according to the departmental dose-optimization program which includes automated exposure control, adjustment of the mA and/or kV according to patient size and/or use of iterative reconstruction technique. CONTRAST:  163m ISOVUE-300 IOPAMIDOL (ISOVUE-300) INJECTION 61% COMPARISON:  10/30/2011 FINDINGS: Lower chest: No acute pleural or parenchymal lung disease. Hepatobiliary: No focal liver abnormality is seen. No gallstones, gallbladder wall thickening, or biliary dilatation. Pancreas: Unremarkable. No pancreatic ductal dilatation or surrounding inflammatory changes. Spleen: Normal in size without focal abnormality. Adrenals/Urinary Tract: Adrenal glands are unremarkable. Kidneys are normal, without renal calculi, focal lesion, or hydronephrosis. Bladder is unremarkable. Stomach/Bowel: No bowel obstruction or ileus. There  is diffuse colonic diverticulosis, which short segment wall thickening and pericolonic fat stranding within the proximal sigmoid colon consistent with acute diverticulitis. 3.4 cm rounded gas filled area in the region of the inflamed colon could reflect a large gas-filled diverticulum versus contained perforation. No fluid collection or abscess at this time. There is no free intraperitoneal gas. Normal appendix right lower quadrant. Vascular/Lymphatic: Aortic atherosclerosis. No enlarged abdominal or pelvic lymph nodes. Reproductive: 3.2 cm fibroid within the uterine fundus. No adnexal masses. Other: No free intraperitoneal fluid or free intraperitoneal gas. No abdominal wall hernia. Musculoskeletal: No acute or destructive bony lesions. Reconstructed images demonstrate no additional findings. IMPRESSION: 1. Acute diverticulitis involving the proximal sigmoid colon. 3.4  cm gas-filled area near the inflamed colon consistent with large gas-filled diverticulum versus contained perforation. No evidence of fluid collection or drainable abscess at this time. 2. Uterine fibroid. 3.  Aortic Atherosclerosis (ICD10-I70.0). These results will be called to the ordering clinician or representative by the Radiologist Assistant, and communication documented in the PACS or [employer]. Electronically Signed   By: Randa Ngo M.D.   On: 08/08/2021 16:28    Assessment & Plan:   Problem List Items Addressed This Visit     Dyslipidemia    Statin intolerant On Vascepa      Relevant Orders   Comprehensive metabolic panel   Lipid panel   Cystitis    New.  Cipro p.o. prescribed      Contact dermatitis    Eyelids - Triamc oint w/caution      B12 deficiency - Primary    On B12      Arthralgia    Chronic arthralgias.  Rheumatology consult         Meds ordered this encounter  Medications   triamcinolone ointment (KENALOG) 0.1 %    Sig: Apply 1 Application topically 3 (three) times daily as needed.     Dispense:  30 g    Refill:  1   ciprofloxacin (CIPRO) 250 MG tablet    Sig: Take 1 tablet (250 mg total) by mouth 2 (two) times daily for 3 days.    Dispense:  6 tablet    Refill:  0      Follow-up: Return in about 6 months (around 09/07/2022) for a follow-up visit.  Walker Kehr, MD

## 2022-03-08 NOTE — Assessment & Plan Note (Signed)
Eyelids - Triamc oint w/caution

## 2022-03-15 ENCOUNTER — Encounter: Payer: Self-pay | Admitting: Internal Medicine

## 2022-03-15 NOTE — Assessment & Plan Note (Signed)
New.  Cipro p.o. prescribed

## 2022-03-24 ENCOUNTER — Telehealth: Payer: Self-pay | Admitting: Internal Medicine

## 2022-03-24 DIAGNOSIS — M25561 Pain in right knee: Secondary | ICD-10-CM

## 2022-03-24 NOTE — Telephone Encounter (Signed)
Patient would like to be referred to a rheumatologist - patient states that she has spoken to Dr. Alain Marion about this previously.  Patient:  [phone]

## 2022-03-25 NOTE — Telephone Encounter (Signed)
Notified pt referral has been placed../lmb 

## 2022-03-25 NOTE — Telephone Encounter (Signed)
Okay.  Thanks.

## 2022-05-08 ENCOUNTER — Other Ambulatory Visit: Payer: Self-pay | Admitting: Internal Medicine

## 2022-06-17 ENCOUNTER — Telehealth: Payer: Self-pay | Admitting: Internal Medicine

## 2022-06-17 ENCOUNTER — Encounter: Payer: Self-pay | Admitting: Internal Medicine

## 2022-06-17 ENCOUNTER — Ambulatory Visit (INDEPENDENT_AMBULATORY_CARE_PROVIDER_SITE_OTHER): Payer: 59 | Admitting: Internal Medicine

## 2022-06-17 ENCOUNTER — Other Ambulatory Visit: Payer: Self-pay | Admitting: Internal Medicine

## 2022-06-17 VITALS — BP 118/82 | HR 70 | Temp 98.2°F | Ht 67.0 in | Wt 200.0 lb

## 2022-06-17 DIAGNOSIS — E538 Deficiency of other specified B group vitamins: Secondary | ICD-10-CM | POA: Diagnosis not present

## 2022-06-17 DIAGNOSIS — M3505 Sjogren syndrome with inflammatory arthritis: Secondary | ICD-10-CM

## 2022-06-17 DIAGNOSIS — E559 Vitamin D deficiency, unspecified: Secondary | ICD-10-CM | POA: Diagnosis not present

## 2022-06-17 DIAGNOSIS — E785 Hyperlipidemia, unspecified: Secondary | ICD-10-CM

## 2022-06-17 MED ORDER — PITAVASTATIN MAGNESIUM 2 MG PO TABS: 2.0000 mg | ORAL_TABLET | Freq: Every day | ORAL | 11 refills | Status: DC

## 2022-06-17 MED ORDER — EPINEPHRINE 0.3 MG/0.3ML IJ SOAJ: 0.3000 mg | INTRAMUSCULAR | 3 refills | Status: AC | PRN

## 2022-06-17 NOTE — Assessment & Plan Note (Signed)
Continue with vitamin D 

## 2022-06-17 NOTE — Telephone Encounter (Signed)
Error

## 2022-06-17 NOTE — Telephone Encounter (Signed)
Rec'd msg : Alternative Requested:NOT COVERED. ..Teresa Hubbard

## 2022-06-17 NOTE — Assessment & Plan Note (Signed)
Will restart Pitavastatin

## 2022-06-17 NOTE — Assessment & Plan Note (Addendum)
Sjogren's syndrome with arthralgias.  The patient was prescribed hydroxychloroquine and 5 mg prednisone by Dr. Kathlene November.  She has not started the medication yet.  Instead she is started to use bee venom treatments.  She had 1 treatment in [place] and one treatment and [place].  She is worried about potential side effects developing allergy to venom.  She is asking me to prescribe her a EpiPen. She would like to see Dr. Ouida Sills [facility] rheumatology) for a second opinion.  I asked close to try prednisone and hydroxychloroquine as Dr. Kathlene November has instructed. She will discontinue bee venom treatments if any signs of allergy develop.  EpiPen prescription was provided.  She carries Benadryl with her.

## 2022-06-17 NOTE — Progress Notes (Signed)
Subjective:  Patient ID: Teresa Hubbard, female    DOB: February 09, 1961  Age: 62 y.o. MRN: [id number]  CC: Referral   HPI [name] presents for Sjogren's syndrome, concerns about bee venom treatments, dyslipidemia  Outpatient Medications Prior to Visit  Medication Sig Dispense Refill   Cholecalciferol (VITAMIN D3) 50 MCG (2000 UT) capsule Take 1 capsule (2,000 Units total) by mouth daily. 100 capsule 3   Cyanocobalamin (VITAMIN B-12) 1000 MCG SUBL Place 0.5 tablets (500 mcg total) under the tongue daily. 100 tablet 3   diclofenac (VOLTAREN) 75 MG EC tablet TAKE 1 TABLET (75 MG TOTAL) BY MOUTH 2 (TWO) TIMES DAILY AS NEEDED FOR MODERATE PAIN. 60 tablet 2   HYDROcodone-acetaminophen (NORCO/VICODIN) 5-325 MG tablet Take 1 tablet by mouth every 4 (four) hours as needed for severe pain. 20 tablet 0   hydrocortisone (ANUSOL-HC) 2.5 % rectal cream Place 1 application. rectally 2 (two) times daily. 60 g 1   icosapent Ethyl (VASCEPA) 1 g capsule Take 2 capsules (2 g total) by mouth 2 (two) times daily. 360 capsule 3   Probiotic Product (ALIGN) 4 MG CAPS Take 1 capsule (4 mg total) by mouth daily. 30 capsule 5   triamcinolone ointment (KENALOG) 0.1 % Apply 1 Application topically 3 (three) times daily as needed. 30 g 1   No facility-administered medications prior to visit.    ROS: Review of Systems  Constitutional:  Negative for activity change, appetite change, chills, fatigue and unexpected weight change.  HENT:  Negative for congestion, mouth sores and sinus pressure.   Eyes:  Negative for visual disturbance.  Respiratory:  Negative for cough and chest tightness.   Gastrointestinal:  Negative for abdominal pain and nausea.  Genitourinary:  Negative for difficulty urinating, frequency and vaginal pain.  Musculoskeletal:  Positive for arthralgias. Negative for back pain and gait problem.  Skin:  Negative for pallor and rash.  Neurological:  Negative for dizziness, tremors, weakness, numbness  and headaches.  Psychiatric/Behavioral:  Negative for confusion and sleep disturbance.     Objective:  BP 118/82 (BP Location: Left Arm, Patient Position: Sitting, Cuff Size: Normal)   Pulse 70   Temp 98.2 F (36.8 C) (Oral)   Ht 5\' 7"  (1.702 m)   Wt 200 lb (90.7 kg)   SpO2 96%   BMI 31.32 kg/m   BP Readings from Last 3 Encounters:  06/17/22 118/82  03/08/22 120/80  02/01/22 112/82    Wt Readings from Last 3 Encounters:  06/17/22 200 lb (90.7 kg)  03/08/22 201 lb (91.2 kg)  02/01/22 199 lb 6.4 oz (90.4 kg)    Physical Exam Constitutional:      General: She is not in acute distress.    Appearance: She is well-developed. She is obese.  HENT:     Head: Normocephalic.     Right Ear: External ear normal.     Left Ear: External ear normal.     Nose: Nose normal.  Eyes:     General:        Right eye: No discharge.        Left eye: No discharge.     Conjunctiva/sclera: Conjunctivae normal.     Pupils: Pupils are equal, round, and reactive to light.  Neck:     Thyroid: No thyromegaly.     Vascular: No JVD.     Trachea: No tracheal deviation.  Cardiovascular:     Rate and Rhythm: Normal rate and regular rhythm.     Heart  sounds: Normal heart sounds.  Pulmonary:     Effort: No respiratory distress.     Breath sounds: No stridor. No wheezing.  Abdominal:     General: Bowel sounds are normal. There is no distension.     Palpations: Abdomen is soft. There is no mass.     Tenderness: There is no abdominal tenderness. There is no guarding or rebound.  Musculoskeletal:        General: Tenderness present.     Cervical back: Normal range of motion and neck supple. No rigidity.  Lymphadenopathy:     Cervical: No cervical adenopathy.  Skin:    Findings: No erythema or rash.  Neurological:     Cranial Nerves: No cranial nerve deficit.     Motor: No abnormal muscle tone.     Coordination: Coordination normal.     Deep Tendon Reflexes: Reflexes normal.  Psychiatric:         Behavior: Behavior normal.        Thought Content: Thought content normal.        Judgment: Judgment normal.   Painful wrists and finger joints   A total time of 45 minutes was spent preparing to see the patient, reviewing tests, x-rays, operative reports and other medical records.  Also, obtaining history and performing comprehensive physical exam.  Additionally, counseling the patient regarding the above listed issues -Sjogren syndrome, dyslipidemia, statin intolerance.   Finally, documenting clinical information in the health records, coordination of care, educating the patient.   Lab Results  Component Value Date   WBC 4.5 02/16/2022   HGB 14.2 02/16/2022   HCT 41.6 02/16/2022   PLT 254.0 02/16/2022   GLUCOSE 95 02/22/2022   CHOL 350 (H) 02/22/2022   TRIG 148.0 02/22/2022   HDL 55.80 02/22/2022   LDLDIRECT 243.0 12/31/2019   LDLCALC 265 (H) 02/22/2022   ALT 34 02/22/2022   AST 18 02/22/2022   NA 137 02/22/2022   K 4.2 02/22/2022   CL 101 02/22/2022   CREATININE 0.91 02/22/2022   BUN 11 02/22/2022   CO2 29 02/22/2022   TSH 4.73 02/16/2022   PSA 0.00 (L) 01/22/2011   HGBA1C 5.4 08/23/2018    CT Abdomen Pelvis W Contrast  Result Date: 08/08/2021 CLINICAL DATA:  Left lower quadrant abdominal pain, diverticulitis, symptoms for 1.5 weeks EXAM: CT ABDOMEN AND PELVIS WITH CONTRAST TECHNIQUE: Multidetector CT imaging of the abdomen and pelvis was performed using the standard protocol following bolus administration of intravenous contrast. RADIATION DOSE REDUCTION: This exam was performed according to the departmental dose-optimization program which includes automated exposure control, adjustment of the mA and/or kV according to patient size and/or use of iterative reconstruction technique. CONTRAST:  113mL ISOVUE-300 IOPAMIDOL (ISOVUE-300) INJECTION 61% COMPARISON:  10/30/2011 FINDINGS: Lower chest: No acute pleural or parenchymal lung disease. Hepatobiliary: No focal liver abnormality  is seen. No gallstones, gallbladder wall thickening, or biliary dilatation. Pancreas: Unremarkable. No pancreatic ductal dilatation or surrounding inflammatory changes. Spleen: Normal in size without focal abnormality. Adrenals/Urinary Tract: Adrenal glands are unremarkable. Kidneys are normal, without renal calculi, focal lesion, or hydronephrosis. Bladder is unremarkable. Stomach/Bowel: No bowel obstruction or ileus. There is diffuse colonic diverticulosis, which short segment wall thickening and pericolonic fat stranding within the proximal sigmoid colon consistent with acute diverticulitis. 3.4 cm rounded gas filled area in the region of the inflamed colon could reflect a large gas-filled diverticulum versus contained perforation. No fluid collection or abscess at this time. There is no free intraperitoneal gas. Normal appendix  right lower quadrant. Vascular/Lymphatic: Aortic atherosclerosis. No enlarged abdominal or pelvic lymph nodes. Reproductive: 3.2 cm fibroid within the uterine fundus. No adnexal masses. Other: No free intraperitoneal fluid or free intraperitoneal gas. No abdominal wall hernia. Musculoskeletal: No acute or destructive bony lesions. Reconstructed images demonstrate no additional findings. IMPRESSION: 1. Acute diverticulitis involving the proximal sigmoid colon. 3.4 cm gas-filled area near the inflamed colon consistent with large gas-filled diverticulum versus contained perforation. No evidence of fluid collection or drainable abscess at this time. 2. Uterine fibroid. 3.  Aortic Atherosclerosis (ICD10-I70.0). These results will be called to the ordering clinician or representative by the Radiologist Assistant, and communication documented in the PACS or [employer]. Electronically Signed   By: Randa Ngo M.D.   On: 08/08/2021 16:28    Assessment & Plan:   Problem List Items Addressed This Visit       Musculoskeletal and Integument   Sjogren syndrome with inflammatory  arthritis ([place]) - Primary     Sjogren's syndrome with arthralgias.  The patient was prescribed hydroxychloroquine and 5 mg prednisone by Dr. Kathlene November.  She has not started the medication yet.  Instead she is started to use bee venom treatments.  She had 1 treatment in [place] and one treatment and [place].  She is worried about potential side effects developing allergy to venom.  She is asking me to prescribe her a EpiPen. She would like to see Dr. O[facility]ida [facility] rheumatology) for a second opinion.  I asked close to try prednisone and hydroxychloroquine as Dr. Kathlene November has instructed. She will discontinue bee venom treatments if any signs of allergy develop.  EpiPen prescription was provided.  She carries Benadryl with her.      Relevant Orders   Ambulatory referral to Rheumatology     Other   Vitamin D deficiency    Continue with vitamin D      Dyslipidemia    Will restart Pitavastatin      Relevant Medications   Pitavastatin Magnesium 2 MG TABS   B12 deficiency    Chronic On B12         Meds ordered this encounter  Medications   Pitavastatin Magnesium 2 MG TABS    Sig: Take 2 mg by mouth daily.    Dispense:  30 tablet    Refill:  11    Statin intolerant   EPINEPHrine (EPIPEN 2-PAK) 0.3 mg/0.3 mL IJ SOAJ injection    Sig: Inject 0.3 mg into the muscle as needed for anaphylaxis.    Dispense:  1 each    Refill:  3      Follow-up: Return in about 3 months (around 09/17/2022) for a follow-up visit.  Walker Kehr, MD

## 2022-06-17 NOTE — Assessment & Plan Note (Signed)
Chronic  On B12 

## 2022-06-20 ENCOUNTER — Other Ambulatory Visit: Payer: Self-pay | Admitting: Internal Medicine

## 2022-06-21 ENCOUNTER — Telehealth: Payer: Self-pay | Admitting: Internal Medicine

## 2022-06-21 MED ORDER — PITAVASTATIN MAGNESIUM 2 MG PO TABS: 2.0000 mg | ORAL_TABLET | Freq: Every day | ORAL | 11 refills | Status: DC

## 2022-06-21 NOTE — Telephone Encounter (Signed)
Alternative Requested:NOT COVERED...Johny Chess

## 2022-06-21 NOTE — Telephone Encounter (Signed)
Prescription Request  06/21/2022  LOV: 06/17/2022  What is the name of the medication or equipment? Pitavastin   Have you contacted your pharmacy to request a refill? No   Which pharmacy would you like this sent to?   [facility] in [place], [place]   Patient notified that their request is being sent to the clinical staff for review and that they should receive a response within 2 business days.   Please advise at Mobile [phone] (mobile)

## 2022-06-21 NOTE — Telephone Encounter (Signed)
Resent rx to [facility].Marland KitchenJohny Chess

## 2022-06-24 ENCOUNTER — Other Ambulatory Visit: Payer: Self-pay | Admitting: Internal Medicine

## 2022-06-24 MED ORDER — PITAVASTATIN MAGNESIUM 2 MG PO TABS: 2.0000 mg | ORAL_TABLET | Freq: Every day | ORAL | 11 refills | Status: AC

## 2022-09-20 ENCOUNTER — Encounter: Payer: Self-pay | Admitting: Internal Medicine

## 2022-09-20 ENCOUNTER — Ambulatory Visit (INDEPENDENT_AMBULATORY_CARE_PROVIDER_SITE_OTHER): Payer: 59 | Admitting: Internal Medicine

## 2022-09-20 VITALS — BP 120/70 | HR 67 | Temp 98.6°F | Ht 67.0 in | Wt 204.0 lb

## 2022-09-20 DIAGNOSIS — G4709 Other insomnia: Secondary | ICD-10-CM | POA: Diagnosis not present

## 2022-09-20 DIAGNOSIS — M25561 Pain in right knee: Secondary | ICD-10-CM | POA: Diagnosis not present

## 2022-09-20 DIAGNOSIS — E559 Vitamin D deficiency, unspecified: Secondary | ICD-10-CM | POA: Diagnosis not present

## 2022-09-20 DIAGNOSIS — E785 Hyperlipidemia, unspecified: Secondary | ICD-10-CM

## 2022-09-20 DIAGNOSIS — H04129 Dry eye syndrome of unspecified lacrimal gland: Secondary | ICD-10-CM | POA: Insufficient documentation

## 2022-09-20 DIAGNOSIS — E538 Deficiency of other specified B group vitamins: Secondary | ICD-10-CM

## 2022-09-20 MED ORDER — HYDROCORTISONE (PERIANAL) 2.5 % EX CREA: 1.0000 | TOPICAL_CREAM | Freq: Two times a day (BID) | CUTANEOUS | 1 refills | Status: AC

## 2022-09-20 MED ORDER — CIPROFLOXACIN HCL 500 MG PO TABS: 500.0000 mg | ORAL_TABLET | Freq: Two times a day (BID) | ORAL | 0 refills | Status: AC

## 2022-09-20 NOTE — Assessment & Plan Note (Signed)
Re-start Vit D 

## 2022-09-20 NOTE — Progress Notes (Signed)
Subjective:  Patient ID: Teresa Hubbard, female    DOB: 10/21/1960  Age: 62 y.o. MRN: [id number]  CC: Follow-up (Check cholesterol levels, and other bloodwork)   HPI Teresa Hubbard presents for Sjogren's, diverticulosis, dyslipidemia Not taking Rx meds   Outpatient Medications Prior to Visit  Medication Sig Dispense Refill   Cholecalciferol (VITAMIN D3) 50 MCG (2000 UT) capsule Take 1 capsule (2,000 Units total) by mouth daily. 100 capsule 3   Cyanocobalamin (VITAMIN B-12) 1000 MCG SUBL Place 0.5 tablets (500 mcg total) under the tongue daily. 100 tablet 3   diclofenac (VOLTAREN) 75 MG EC tablet TAKE 1 TABLET (75 MG TOTAL) BY MOUTH 2 (TWO) TIMES DAILY AS NEEDED FOR MODERATE PAIN. 60 tablet 2   EPINEPHrine (EPIPEN 2-PAK) 0.3 mg/0.3 mL IJ SOAJ injection Inject 0.3 mg into the muscle as needed for anaphylaxis. 1 each 3   icosapent Ethyl (VASCEPA) 1 g capsule Take 2 capsules (2 g total) by mouth 2 (two) times daily. 360 capsule 3   Pitavastatin Magnesium 2 MG TABS Take 2 mg by mouth daily. 30 tablet 11   Probiotic Product (ALIGN) 4 MG CAPS Take 1 capsule (4 mg total) by mouth daily. 30 capsule 5   triamcinolone ointment (KENALOG) 0.1 % Apply 1 Application topically 3 (three) times daily as needed. 30 g 1   hydrocortisone (ANUSOL-HC) 2.5 % rectal cream Place 1 application. rectally 2 (two) times daily. 60 g 1   No facility-administered medications prior to visit.    ROS: Review of Systems  Constitutional:  Negative for activity change, appetite change, chills, fatigue and unexpected weight change.  HENT:  Negative for congestion, mouth sores and sinus pressure.   Eyes:  Negative for visual disturbance.  Respiratory:  Negative for cough and chest tightness.   Gastrointestinal:  Negative for abdominal pain and nausea.  Genitourinary:  Negative for difficulty urinating, frequency and vaginal pain.  Musculoskeletal:  Positive for arthralgias. Negative for back pain and gait problem.  Skin:   Negative for pallor and rash.  Neurological:  Negative for dizziness, tremors, weakness, numbness and headaches.  Psychiatric/Behavioral:  Negative for confusion and sleep disturbance.     Objective:  BP 120/70 (BP Location: Left Arm, Patient Position: Sitting, Cuff Size: Large)   Pulse 67   Temp 98.6 F (37 C) (Oral)   Ht 5\' 7"  (1.702 m)   Wt 204 lb (92.5 kg)   SpO2 97%   BMI 31.95 kg/m   BP Readings from Last 3 Encounters:  09/20/22 120/70  06/17/22 118/82  03/08/22 120/80    Wt Readings from Last 3 Encounters:  09/20/22 204 lb (92.5 kg)  06/17/22 200 lb (90.7 kg)  03/08/22 201 lb (91.2 kg)    Physical Exam Constitutional:      General: She is not in acute distress.    Appearance: Normal appearance. She is well-developed.  HENT:     Head: Normocephalic.     Right Ear: External ear normal.     Left Ear: External ear normal.     Nose: Nose normal.  Eyes:     General:        Right eye: No discharge.        Left eye: No discharge.     Conjunctiva/sclera: Conjunctivae normal.     Pupils: Pupils are equal, round, and reactive to light.  Neck:     Thyroid: No thyromegaly.     Vascular: No JVD.     Trachea: No tracheal deviation.  Cardiovascular:     Rate and Rhythm: Normal rate and regular rhythm.     Heart sounds: Normal heart sounds.  Pulmonary:     Effort: No respiratory distress.     Breath sounds: No stridor. No wheezing.  Abdominal:     General: Bowel sounds are normal. There is no distension.     Palpations: Abdomen is soft. There is no mass.     Tenderness: There is no abdominal tenderness. There is no guarding or rebound.  Musculoskeletal:        General: No tenderness.     Cervical back: Normal range of motion and neck supple. No rigidity.  Lymphadenopathy:     Cervical: No cervical adenopathy.  Skin:    Findings: No erythema or rash.  Neurological:     Mental Status: She is oriented to person, place, and time.     Cranial Nerves: No cranial  nerve deficit.     Motor: No abnormal muscle tone.     Coordination: Coordination normal.     Deep Tendon Reflexes: Reflexes normal.  Psychiatric:        Behavior: Behavior normal.        Thought Content: Thought content normal.        Judgment: Judgment normal.     Lab Results  Component Value Date   WBC 4.5 02/16/2022   HGB 14.2 02/16/2022   HCT 41.6 02/16/2022   PLT 254.0 02/16/2022   GLUCOSE 95 09/24/2022   CHOL 270 (H) 09/24/2022   TRIG 106.0 09/24/2022   HDL 44.30 09/24/2022   LDLDIRECT 243.0 12/31/2019   LDLCALC 205 (H) 09/24/2022   ALT 24 09/24/2022   AST 17 09/24/2022   NA 138 09/24/2022   K 4.1 09/24/2022   CL 106 09/24/2022   CREATININE 0.86 09/24/2022   BUN 13 09/24/2022   CO2 27 09/24/2022   TSH 4.73 02/16/2022   PSA 0.00 (L) 01/22/2011   HGBA1C 5.4 08/23/2018    CT Abdomen Pelvis W Contrast  Result Date: 08/08/2021 CLINICAL DATA:  Left lower quadrant abdominal pain, diverticulitis, symptoms for 1.5 weeks EXAM: CT ABDOMEN AND PELVIS WITH CONTRAST TECHNIQUE: Multidetector CT imaging of the abdomen and pelvis was performed using the standard protocol following bolus administration of intravenous contrast. RADIATION DOSE REDUCTION: This exam was performed according to the departmental dose-optimization program which includes automated exposure control, adjustment of the mA and/or kV according to patient size and/or use of iterative reconstruction technique. CONTRAST:  ISOVUE-300 IOPAMIDOL (ISOVUE-300) INJECTION 61% COMPARISON:  10/30/2011 FINDINGS: Lower chest: No acute pleural or parenchymal lung disease. Hepatobiliary: No focal liver abnormality is seen. No gallstones, gallbladder wall thickening, or biliary dilatation. Pancreas: Unremarkable. No pancreatic ductal dilatation or surrounding inflammatory changes. Spleen: Normal in size without focal abnormality. Adrenals/Urinary Tract: Adrenal glands are unremarkable. Kidneys are normal, without renal calculi,  focal lesion, or hydronephrosis. Bladder is unremarkable. Stomach/Bowel: No bowel obstruction or ileus. There is diffuse colonic diverticulosis, which short segment wall thickening and pericolonic fat stranding within the proximal sigmoid colon consistent with acute diverticulitis. 3.4 cm rounded gas filled area in the region of the inflamed colon could reflect a large gas-filled diverticulum versus contained perforation. No fluid collection or abscess at this time. There is no free intraperitoneal gas. Normal appendix right lower quadrant. Vascular/Lymphatic: Aortic atherosclerosis. No enlarged abdominal or pelvic lymph nodes. Reproductive: 3.2 cm fibroid within the uterine fundus. No adnexal masses. Other: No free intraperitoneal fluid or free intraperitoneal gas. No abdominal wall hernia.  Musculoskeletal: No acute or destructive bony lesions. Reconstructed images demonstrate no additional findings. IMPRESSION: 1. Acute diverticulitis involving the proximal sigmoid colon. 3.4 cm gas-filled area near the inflamed colon consistent with large gas-filled diverticulum versus contained perforation. No evidence of fluid collection or drainable abscess at this time. 2. Uterine fibroid. 3.  Aortic Atherosclerosis (ICD10-I70.0). These results will be called to the ordering clinician or representative by the Radiologist Assistant, and communication documented in the PACS or [employer]. Electronically Signed   By: Sharlet Salina M.D.   On: 08/08/2021 16:28    Assessment & Plan:   Problem List Items Addressed This Visit     B12 deficiency    On B12      Vitamin D deficiency - Primary    Re-start Vit D      Dyslipidemia    On Vascepa -stopped, not covered We can restart Pitavastatin if tolerated      Relevant Orders   Comprehensive metabolic panel (Completed)   Lipid panel (Completed)   Arthralgia    Statin intolerant On Vascepa      Insomnia    Better         Meds ordered this encounter   Medications   hydrocortisone (ANUSOL-HC) 2.5 % rectal cream    Sig: Place 1 Application rectally 2 (two) times daily.    Dispense:  60 g    Refill:  1   ciprofloxacin (CIPRO) 500 MG tablet    Sig: Take 1 tablet (500 mg total) by mouth 2 (two) times daily for 10 days.    Dispense:  20 tablet    Refill:  0      Follow-up: Return in about 4 months (around 01/20/2023).  Sonda Primes, MD

## 2022-09-20 NOTE — Assessment & Plan Note (Signed)
Statin intolerant ?On Vascepa ?

## 2022-09-20 NOTE — Assessment & Plan Note (Signed)
On B12 

## 2022-09-20 NOTE — Assessment & Plan Note (Signed)
Better  

## 2022-09-24 ENCOUNTER — Telehealth: Payer: Self-pay | Admitting: *Deleted

## 2022-09-24 ENCOUNTER — Other Ambulatory Visit (INDEPENDENT_AMBULATORY_CARE_PROVIDER_SITE_OTHER): Payer: 59

## 2022-09-24 DIAGNOSIS — E785 Hyperlipidemia, unspecified: Secondary | ICD-10-CM

## 2022-09-24 DIAGNOSIS — N3090 Cystitis, unspecified without hematuria: Secondary | ICD-10-CM

## 2022-09-24 LAB — COMPREHENSIVE METABOLIC PANEL
ALT: 24 U/L (ref 0–35)
AST: 17 U/L (ref 0–37)
Albumin: 4 g/dL (ref 3.5–5.2)
Alkaline Phosphatase: 35 U/L — ABNORMAL LOW (ref 39–117)
BUN: 13 mg/dL (ref 6–23)
CO2: 27 mEq/L (ref 19–32)
Calcium: 9.1 mg/dL (ref 8.4–10.5)
Chloride: 106 mEq/L (ref 96–112)
Creatinine, Ser: 0.86 mg/dL (ref 0.40–1.20)
GFR: 73.01 mL/min (ref >60.00)
Glucose, Bld: 95 mg/dL (ref 70–99)
Potassium: 4.1 mEq/L (ref 3.5–5.1)
Sodium: 138 mEq/L (ref 135–145)
Total Bilirubin: 0.4 mg/dL (ref 0.2–1.2)
Total Protein: 6.7 g/dL (ref 6.0–8.3)

## 2022-09-24 LAB — LIPID PANEL
Cholesterol: 270 mg/dL — ABNORMAL HIGH (ref 0–200)
HDL: 44.3 mg/dL (ref >39.00)
LDL Cholesterol: 205 mg/dL — ABNORMAL HIGH (ref 0–99)
NonHDL: 225.7
Total CHOL/HDL Ratio: 6
Triglycerides: 106 mg/dL (ref 0.0–149.0)
VLDL: 21.2 mg/dL (ref 0.0–40.0)

## 2022-09-24 LAB — URINALYSIS, ROUTINE W REFLEX MICROSCOPIC
Bilirubin Urine: NEGATIVE
Hgb urine dipstick: NEGATIVE
Ketones, ur: NEGATIVE
Nitrite: NEGATIVE
RBC / HPF: NONE SEEN (ref 0–?)
Specific Gravity, Urine: 1.025 (ref 1.000–1.030)
Total Protein, Urine: NEGATIVE
Urine Glucose: NEGATIVE
Urobilinogen, UA: 0.2 (ref 0.0–1.0)
pH: 6 (ref 5.0–8.0)

## 2022-09-24 NOTE — Telephone Encounter (Signed)
Rec'd msg from lab...  [10:08 AM] Clodfelter, Tammy goodmorning, is dr.plot working today?   [10:08 AM] Ahmed Inniss no   [10:09 AM] Clodfelter, Tammy oh ok I had two pts that would like a urine test added to their labs they said they usually get it done with labs would you be able to add those for me?   [10:10 AM] Paz Winsett who is patient   [10:12 AM] Clodfelter, Tammy the pts left urine samples, if not ill just toss them but they were very concerned. Teresa Hubbard, Teresa Hubbard 10/17/1960 and Teresa Hubbard, Teresa Hubbard [birth year]   [10:15 AM] Orlean Holtrop put UA in   

## 2022-10-27 ENCOUNTER — Encounter: Payer: Self-pay | Admitting: Internal Medicine

## 2022-10-27 NOTE — Assessment & Plan Note (Signed)
On Vascepa -stopped, not covered We can restart Pitavastatin if tolerated

## 2023-05-05 ENCOUNTER — Ambulatory Visit: Payer: Self-pay | Admitting: Internal Medicine

## 2023-05-05 NOTE — Telephone Encounter (Signed)
 Copied from CRM [id number]. Topic: Clinical - Red Word Triage >> May 05, 2023  2:21 PM Evie B wrote: [facility] that prompted transfer to Nurse Triage: heart palpitation. Pt states it' happens suddenly. Answer Assessment - Initial Assessment Questions 1. DESCRIPTION: Please describe your heart rate or heartbeat that you are having (e.g., fast/slow, regular/irregular, skipped or extra beats, palpitations)     Patient feels okay today. She had multiple episodes of heart palpitations previously.  2. ONSET: When did it start? (Minutes, hours or days)      Last week, occurred on 4 different days  3. DURATION: How long does it last (e.g., seconds, minutes, hours)     It would last for a couple secs then go away  4. PATTERN Does it come and go, or has it been constant since it started?  Does it get worse with exertion?   Are you feeling it now?     The palpitations come and go last week. No symptoms today  5. RECURRENT SYMPTOM: Have you ever had this before? If Yes, ask: When was the last time? and What happened that time?      No  6. CAUSE: What do you think is causing the palpitations?    Unknown, not sure if it was anxiety  7. OTHER SYMPTOMS: Do you have any other symptoms? (e.g., dizziness, chest pain, sweating, difficulty breathing)       Patient did have one episode of chest pain that happened one day last week. No chest pain or other symptoms today  Protocols used: Heart Rate and Heartbeat Questions-A-AH   Chief Complaint: Heart palpitations Symptoms: Heart palpitations, loud heartbeat Frequency: Intermittent episodes for 4 days Pertinent Negatives: Patient denies shortness of breath Disposition: [] ED /[] Urgent Care (no appt availability in office) / [x] Appointment(In office/virtual)/ []  [facility]/ [] Home Care/ [] Refused Recommended Disposition /[] [place] [facility]/ []  Follow-up with PCP Additional Notes: Patient stated she had multiple episodes  of heart palpitations that occurred on 4 separate days last week. On one of those days, she also had chest pain/discomfort. Patient is feeling okay today and is not having any symptoms currently. Appointment scheduled for tomorrow.

## 2023-05-06 ENCOUNTER — Ambulatory Visit (INDEPENDENT_AMBULATORY_CARE_PROVIDER_SITE_OTHER): Payer: 59 | Admitting: Family Medicine

## 2023-05-06 ENCOUNTER — Encounter: Payer: Self-pay | Admitting: Family Medicine

## 2023-05-06 ENCOUNTER — Ambulatory Visit: Payer: 59 | Attending: Family Medicine

## 2023-05-06 VITALS — BP 126/76 | HR 62 | Temp 97.7°F | Ht 67.0 in | Wt 199.0 lb

## 2023-05-06 DIAGNOSIS — R0789 Other chest pain: Secondary | ICD-10-CM | POA: Diagnosis not present

## 2023-05-06 DIAGNOSIS — R002 Palpitations: Secondary | ICD-10-CM | POA: Diagnosis not present

## 2023-05-06 DIAGNOSIS — M3505 Sjogren syndrome with inflammatory arthritis: Secondary | ICD-10-CM | POA: Diagnosis not present

## 2023-05-06 LAB — CBC WITH DIFFERENTIAL/PLATELET
Basophils Absolute: 0.1 10*3/uL (ref 0.0–0.1)
Basophils Relative: 2.3 % (ref 0.0–3.0)
Eosinophils Absolute: 0.3 10*3/uL (ref 0.0–0.7)
Eosinophils Relative: 7 % — ABNORMAL HIGH (ref 0.0–5.0)
HCT: 44.1 % (ref 36.0–46.0)
Hemoglobin: 14.7 g/dL (ref 12.0–15.0)
Lymphocytes Relative: 35.8 % (ref 12.0–46.0)
Lymphs Abs: 1.5 10*3/uL (ref 0.7–4.0)
MCHC: 33.4 g/dL (ref 30.0–36.0)
MCV: 89.3 fL (ref 78.0–100.0)
Monocytes Absolute: 0.3 10*3/uL (ref 0.1–1.0)
Monocytes Relative: 8.1 % (ref 3.0–12.0)
Neutro Abs: 2 10*3/uL (ref 1.4–7.7)
Neutrophils Relative %: 46.8 % (ref 43.0–77.0)
Platelets: 269 10*3/uL (ref 150.0–400.0)
RBC: 4.94 Mil/uL (ref 3.87–5.11)
RDW: 14 % (ref 11.5–15.5)
WBC: 4.2 10*3/uL (ref 4.0–10.5)

## 2023-05-06 LAB — COMPREHENSIVE METABOLIC PANEL
ALT: 23 U/L (ref 0–35)
AST: 20 U/L (ref 0–37)
Albumin: 4.3 g/dL (ref 3.5–5.2)
Alkaline Phosphatase: 40 U/L (ref 39–117)
BUN: 13 mg/dL (ref 6–23)
CO2: 29 meq/L (ref 19–32)
Calcium: 9.4 mg/dL (ref 8.4–10.5)
Chloride: 105 meq/L (ref 96–112)
Creatinine, Ser: 0.93 mg/dL (ref 0.40–1.20)
GFR: 66.18 mL/min (ref >60.00)
Glucose, Bld: 91 mg/dL (ref 70–99)
Potassium: 4.4 meq/L (ref 3.5–5.1)
Sodium: 141 meq/L (ref 135–145)
Total Bilirubin: 0.4 mg/dL (ref 0.2–1.2)
Total Protein: 7.4 g/dL (ref 6.0–8.3)

## 2023-05-06 LAB — TROPONIN I (HIGH SENSITIVITY): High Sens Troponin I: 4 ng/L (ref 2–17)

## 2023-05-06 LAB — TSH: TSH: 2.07 u[IU]/mL (ref 0.35–5.50)

## 2023-05-06 LAB — SEDIMENTATION RATE: Sed Rate: 15 mm/h (ref 0–30)

## 2023-05-06 LAB — C-REACTIVE PROTEIN: CRP: <1 mg/dL (ref 0.5–20.0)

## 2023-05-06 NOTE — Progress Notes (Unsigned)
 EP to read.

## 2023-05-06 NOTE — Progress Notes (Signed)
 Acute Office Visit  Subjective:     Patient ID: Teresa Hubbard, female    DOB: 31-Jul-1960, 63 y.o.   MRN: [id number]  Chief Complaint  Patient presents with   Palpitations    Patient states that this happens at night time she hasn't been able to sleep well and she's been very anxious. She also states that 10 different times throughout the day she's having palpitations. This happen for about 9-12 days.     HPI Patient is in today for evaluation of multiple days of palpitations that are worse at night and with anxiety. Reports anxiety as well.  States that she was in [place] with her father and was caring for him over the last month.  Reports that she has been home for the last 2 days, and that symptoms are still present but improving. Reports that she has been able to sleep for the last 2 days.  Does not drink caffeine, denies previous symptoms.  Denies SOB, arm pain, jaw pain, other symptoms with these episodes.   ROS Per HPI      Objective:    BP 126/76 (BP Location: Left Arm, Patient Position: Sitting, Cuff Size: Normal)   Pulse 62   Temp 97.7 F (36.5 C) (Oral)   Ht 5' 7 (1.702 m)   Wt 199 lb (90.3 kg)   SpO2 96%   BMI 31.17 kg/m    Physical Exam Vitals and nursing note reviewed.  Constitutional:      Appearance: Normal appearance. She is normal weight.  HENT:     Head: Normocephalic and atraumatic.     Right Ear: Tympanic membrane and ear canal normal.     Left Ear: Tympanic membrane and ear canal normal.     Nose: Nose normal.  Eyes:     Extraocular Movements: Extraocular movements intact.     Pupils: Pupils are equal, round, and reactive to light.  Cardiovascular:     Rate and Rhythm: Normal rate and regular rhythm.     Heart sounds: Normal heart sounds.  Pulmonary:     Effort: Pulmonary effort is normal.     Breath sounds: Normal breath sounds.  Musculoskeletal:        General: Normal range of motion.     Cervical back: Normal range of motion.   Neurological:     General: No focal deficit present.     Mental Status: She is alert and oriented to person, place, and time.  Psychiatric:        Mood and Affect: Mood normal.        Thought Content: Thought content normal.    No results found for any visits on 05/06/23.  ED ECG REPORT   Date: 05/06/2023  EKG Time: 11:31a  Rate: 62   Rhythm: normal sinus rhythm,  normal EKG, normal sinus rhythm, unchanged from previous tracings  Intervals:none  ST&T Change: none   Narrative Interpretation: NSR        Assessment & Plan:  1. Palpitations (Primary)  - EKG 12-Lead - CBC with Differential/Platelet; Future - Comprehensive metabolic panel; Future - Troponin I (High Sensitivity); Future - TSH; Future - Sedimentation rate; Future - C-reactive protein; Future - C-reactive protein - Sedimentation rate - TSH - Troponin I (High Sensitivity) - Comprehensive metabolic panel - CBC with Differential/Platelet - LONG TERM MONITOR (3-14 DAYS); Future  2. Other chest pain  - LONG TERM MONITOR (3-14 DAYS); Future  3. Sjogren syndrome with inflammatory arthritis (HCC)  - Comprehensive  metabolic panel; Future - TSH; Future - Sedimentation rate; Future - C-reactive protein - Comprehensive metabolic panel - CBC with Differential/Platelet   No orders of the defined types were placed in this encounter.   Return if symptoms worsen or fail to improve.  Corean Ku, FNP

## 2023-05-06 NOTE — Patient Instructions (Signed)
 Your EKG looks absolutely normal today.  I will send in a referral to cardiology for you today, someone will be calling to get you scheduled.   I have ordered a zio patch heart monitor for you.  We are checking labs today, will be in contact with any results that require further attention  Follow-up with me for new or worsening symptoms.

## 2023-05-08 ENCOUNTER — Encounter: Payer: Self-pay | Admitting: Family Medicine

## 2023-05-12 DIAGNOSIS — R0789 Other chest pain: Secondary | ICD-10-CM | POA: Diagnosis not present

## 2023-05-12 DIAGNOSIS — R002 Palpitations: Secondary | ICD-10-CM | POA: Diagnosis not present

## 2023-05-27 ENCOUNTER — Ambulatory Visit (INDEPENDENT_AMBULATORY_CARE_PROVIDER_SITE_OTHER): Payer: 59 | Admitting: Internal Medicine

## 2023-05-27 ENCOUNTER — Encounter: Payer: Self-pay | Admitting: Internal Medicine

## 2023-05-27 VITALS — BP 112/68 | HR 68 | Temp 98.4°F | Wt 204.0 lb

## 2023-05-27 DIAGNOSIS — E785 Hyperlipidemia, unspecified: Secondary | ICD-10-CM

## 2023-05-27 DIAGNOSIS — G4709 Other insomnia: Secondary | ICD-10-CM

## 2023-05-27 DIAGNOSIS — E538 Deficiency of other specified B group vitamins: Secondary | ICD-10-CM

## 2023-05-27 DIAGNOSIS — E559 Vitamin D deficiency, unspecified: Secondary | ICD-10-CM

## 2023-05-27 DIAGNOSIS — N3090 Cystitis, unspecified without hematuria: Secondary | ICD-10-CM | POA: Diagnosis not present

## 2023-05-27 DIAGNOSIS — R002 Palpitations: Secondary | ICD-10-CM | POA: Insufficient documentation

## 2023-05-27 LAB — LIPID PANEL
Cholesterol: 332 mg/dL — ABNORMAL HIGH (ref 0–200)
HDL: 55.7 mg/dL (ref >39.00)
LDL Cholesterol: 248 mg/dL — ABNORMAL HIGH (ref 0–99)
NonHDL: 276.49
Total CHOL/HDL Ratio: 6
Triglycerides: 141 mg/dL (ref 0.0–149.0)
VLDL: 28.2 mg/dL (ref 0.0–40.0)

## 2023-05-27 LAB — URINALYSIS, ROUTINE W REFLEX MICROSCOPIC
Bilirubin Urine: NEGATIVE
Hgb urine dipstick: NEGATIVE
Ketones, ur: NEGATIVE
Nitrite: POSITIVE — AB
RBC / HPF: NONE SEEN (ref 0–?)
Specific Gravity, Urine: >=1.03 — AB (ref 1.000–1.030)
Total Protein, Urine: NEGATIVE
Urine Glucose: NEGATIVE
Urobilinogen, UA: 0.2 (ref 0.0–1.0)
pH: 6 (ref 5.0–8.0)

## 2023-05-27 LAB — VITAMIN D 25 HYDROXY (VIT D DEFICIENCY, FRACTURES): VITD: 39.77 ng/mL (ref 30.00–100.00)

## 2023-05-27 LAB — CORTISOL: Cortisol, Plasma: 7.4 ug/dL

## 2023-05-27 LAB — VITAMIN B12: Vitamin B-12: 304 pg/mL (ref 211–911)

## 2023-05-27 LAB — T4, FREE: Free T4: 0.73 ng/dL (ref 0.60–1.60)

## 2023-05-27 MED ORDER — ZOLPIDEM TARTRATE 10 MG PO TABS: 10.0000 mg | ORAL_TABLET | Freq: Every evening | ORAL | 1 refills | Status: AC | PRN

## 2023-05-27 NOTE — Addendum Note (Signed)
 Addended by: Tresa Garter on: 05/27/2023 12:37 PM   Modules accepted: Orders

## 2023-05-27 NOTE — Assessment & Plan Note (Addendum)
 Resolved ZIopatch was ok Treat insomnia May need a sleep test

## 2023-05-27 NOTE — Patient Instructions (Signed)
Valerian root 

## 2023-05-27 NOTE — Progress Notes (Addendum)
 Subjective:  Patient ID: Teresa Hubbard, female    DOB: 1960-07-27  Age: 63 y.o. MRN: [id number]  CC: Annual Exam   HPI Teresa Hubbard presents for palpitations - no relapse in 1 month,  palpitations in [place] . Her 37 yo dad died. She was on 8600 ft. No relapse x 1 month No CP C/o insomnia Asking for a UA  Outpatient Medications Prior to Visit  Medication Sig Dispense Refill   Cholecalciferol (VITAMIN D3) 50 MCG (2000 UT) capsule Take 1 capsule (2,000 Units total) by mouth daily. (Patient not taking: Reported on 05/27/2023) 100 capsule 3   Cyanocobalamin (VITAMIN B-12) 1000 MCG SUBL Place 0.5 tablets (500 mcg total) under the tongue daily. (Patient not taking: Reported on 05/27/2023) 100 tablet 3   diclofenac (VOLTAREN) 75 MG EC tablet TAKE 1 TABLET (75 MG TOTAL) BY MOUTH 2 (TWO) TIMES DAILY AS NEEDED FOR MODERATE PAIN. (Patient not taking: Reported on 05/27/2023) 60 tablet 2   EPINEPHrine (EPIPEN 2-PAK) 0.3 mg/0.3 mL IJ SOAJ injection Inject 0.3 mg into the muscle as needed for anaphylaxis. (Patient not taking: Reported on 05/27/2023) 1 each 3   hydrocortisone (ANUSOL-HC) 2.5 % rectal cream Place 1 Application rectally 2 (two) times daily. (Patient not taking: Reported on 05/27/2023) 60 g 1   icosapent Ethyl (VASCEPA) 1 g capsule Take 2 capsules (2 g total) by mouth 2 (two) times daily. (Patient not taking: Reported on 05/27/2023) 360 capsule 3   Pitavastatin Magnesium 2 MG TABS Take 2 mg by mouth daily. (Patient not taking: Reported on 05/27/2023) 30 tablet 11   Probiotic Product (ALIGN) 4 MG CAPS Take 1 capsule (4 mg total) by mouth daily. (Patient not taking: Reported on 05/27/2023) 30 capsule 5   triamcinolone ointment (KENALOG) 0.1 % Apply 1 Application topically 3 (three) times daily as needed. (Patient not taking: Reported on 05/27/2023) 30 g 1   No facility-administered medications prior to visit.    ROS: Review of Systems  Constitutional:  Negative for activity change, appetite  change, chills, fatigue and unexpected weight change.  HENT:  Negative for congestion, mouth sores and sinus pressure.   Eyes:  Negative for visual disturbance.  Respiratory:  Negative for cough and chest tightness.   Gastrointestinal:  Negative for abdominal pain and nausea.  Genitourinary:  Negative for difficulty urinating, frequency and vaginal pain.  Musculoskeletal:  Negative for back pain and gait problem.  Skin:  Negative for pallor and rash.  Neurological:  Negative for dizziness, tremors, weakness, numbness and headaches.  Psychiatric/Behavioral:  Positive for sleep disturbance. Negative for confusion, decreased concentration and suicidal ideas. The patient is not nervous/anxious.     Objective:  BP 112/68   Pulse 68   Temp 98.4 F (36.9 C) (Oral)   Wt 204 lb (92.5 kg)   SpO2 97%   BMI 31.95 kg/m   BP Readings from Last 3 Encounters:  05/27/23 112/68  05/06/23 126/76  09/20/22 120/70    Wt Readings from Last 3 Encounters:  05/27/23 204 lb (92.5 kg)  05/06/23 199 lb (90.3 kg)  09/20/22 204 lb (92.5 kg)    Physical Exam Constitutional:      General: She is not in acute distress.    Appearance: Normal appearance. She is well-developed.  HENT:     Head: Normocephalic.     Right Ear: External ear normal.     Left Ear: External ear normal.     Nose: Nose normal.  Eyes:  General:        Right eye: No discharge.        Left eye: No discharge.     Conjunctiva/sclera: Conjunctivae normal.     Pupils: Pupils are equal, round, and reactive to light.  Neck:     Thyroid: No thyromegaly.     Vascular: No JVD.     Trachea: No tracheal deviation.  Cardiovascular:     Rate and Rhythm: Normal rate and regular rhythm.     Heart sounds: Normal heart sounds.  Pulmonary:     Effort: No respiratory distress.     Breath sounds: No stridor. No wheezing.  Abdominal:     General: Bowel sounds are normal. There is no distension.     Palpations: Abdomen is soft. There is  no mass.     Tenderness: There is no abdominal tenderness. There is no guarding or rebound.  Musculoskeletal:        General: No tenderness.     Cervical back: Normal range of motion and neck supple. No rigidity.  Lymphadenopathy:     Cervical: No cervical adenopathy.  Skin:    Findings: No erythema or rash.  Neurological:     Cranial Nerves: No cranial nerve deficit.     Motor: No abnormal muscle tone.     Coordination: Coordination normal.     Deep Tendon Reflexes: Reflexes normal.  Psychiatric:        Behavior: Behavior normal.        Thought Content: Thought content normal.        Judgment: Judgment normal.     Lab Results  Component Value Date   WBC 4.2 05/06/2023   HGB 14.7 05/06/2023   HCT 44.1 05/06/2023   PLT 269.0 05/06/2023   GLUCOSE 91 05/06/2023   CHOL 270 (H) 09/24/2022   TRIG 106.0 09/24/2022   HDL 44.30 09/24/2022   LDLDIRECT 243.0 12/31/2019   LDLCALC 205 (H) 09/24/2022   ALT 23 05/06/2023   AST 20 05/06/2023   NA 141 05/06/2023   K 4.4 05/06/2023   CL 105 05/06/2023   CREATININE 0.93 05/06/2023   BUN 13 05/06/2023   CO2 29 05/06/2023   TSH 2.07 05/06/2023   PSA 0.00 (L) 01/22/2011   HGBA1C 5.4 08/23/2018    CT Abdomen Pelvis W Contrast Result Date: 08/08/2021 CLINICAL DATA:  Left lower quadrant abdominal pain, diverticulitis, symptoms for 1.5 weeks EXAM: CT ABDOMEN AND PELVIS WITH CONTRAST TECHNIQUE: Multidetector CT imaging of the abdomen and pelvis was performed using the standard protocol following bolus administration of intravenous contrast. RADIATION DOSE REDUCTION: This exam was performed according to the departmental dose-optimization program which includes automated exposure control, adjustment of the mA and/or kV according to patient size and/or use of iterative reconstruction technique. CONTRAST:  ISOVUE-300 IOPAMIDOL (ISOVUE-300) INJECTION 61% COMPARISON:  10/30/2011 FINDINGS: Lower chest: No acute pleural or parenchymal lung disease.  Hepatobiliary: No focal liver abnormality is seen. No gallstones, gallbladder wall thickening, or biliary dilatation. Pancreas: Unremarkable. No pancreatic ductal dilatation or surrounding inflammatory changes. Spleen: Normal in size without focal abnormality. Adrenals/Urinary Tract: Adrenal glands are unremarkable. Kidneys are normal, without renal calculi, focal lesion, or hydronephrosis. Bladder is unremarkable. Stomach/Bowel: No bowel obstruction or ileus. There is diffuse colonic diverticulosis, which short segment wall thickening and pericolonic fat stranding within the proximal sigmoid colon consistent with acute diverticulitis. 3.4 cm rounded gas filled area in the region of the inflamed colon could reflect a large gas-filled diverticulum versus contained perforation.  No fluid collection or abscess at this time. There is no free intraperitoneal gas. Normal appendix right lower quadrant. Vascular/Lymphatic: Aortic atherosclerosis. No enlarged abdominal or pelvic lymph nodes. Reproductive: 3.2 cm fibroid within the uterine fundus. No adnexal masses. Other: No free intraperitoneal fluid or free intraperitoneal gas. No abdominal wall hernia. Musculoskeletal: No acute or destructive bony lesions. Reconstructed images demonstrate no additional findings. IMPRESSION: 1. Acute diverticulitis involving the proximal sigmoid colon. 3.4 cm gas-filled area near the inflamed colon consistent with large gas-filled diverticulum versus contained perforation. No evidence of fluid collection or drainable abscess at this time. 2. Uterine fibroid. 3.  Aortic Atherosclerosis (ICD10-I70.0). These results will be called to the ordering clinician or representative by the Radiologist Assistant, and communication documented in the PACS or [employer]. Electronically Signed   By: Sharlet Salina M.D.   On: 08/08/2021 16:28    Assessment & Plan:   Problem List Items Addressed This Visit     B12 deficiency   Relevant Orders    Vitamin B12   Vitamin D deficiency   Relevant Orders   VITAMIN D 25 Hydroxy (Vit-D Deficiency, Fractures)   Dyslipidemia   Relevant Orders   Lipid panel   Insomnia disorder - Primary   Valerian root  Multifactorial.  Will use Ambien intermittently with caution.  Potential benefits of a long term benzodiazepines  use as well as potential risks  and complications were explained to the patient and were aknowledged.       Palpitations   Resolved ZIopatch was ok Treat insomnia May need a sleep test      Relevant Orders   T4, free   Cortisol   Lipid panel      Meds ordered this encounter  Medications   zolpidem (AMBIEN) 10 MG tablet    Sig: Take 1 tablet (10 mg total) by mouth at bedtime as needed for sleep.    Dispense:  30 tablet    Refill:  1      Follow-up: Return in about 6 months (around 11/24/2023) for Wellness Exam.  Sonda Primes, MD

## 2023-05-27 NOTE — Assessment & Plan Note (Signed)
 Valerian root  Multifactorial.  Will use Ambien intermittently with caution.  Potential benefits of a long term benzodiazepines  use as well as potential risks  and complications were explained to the patient and were aknowledged.

## 2023-05-31 ENCOUNTER — Encounter: Payer: Self-pay | Admitting: Internal Medicine

## 2023-05-31 ENCOUNTER — Other Ambulatory Visit: Payer: Self-pay | Admitting: Internal Medicine

## 2023-05-31 MED ORDER — CEFUROXIME AXETIL 250 MG PO TABS: 250.0000 mg | ORAL_TABLET | Freq: Two times a day (BID) | ORAL | 0 refills | Status: DC

## 2023-07-28 ENCOUNTER — Telehealth: Payer: Self-pay | Admitting: Internal Medicine

## 2023-07-28 DIAGNOSIS — R399 Unspecified symptoms and signs involving the genitourinary system: Secondary | ICD-10-CM

## 2023-07-28 NOTE — Telephone Encounter (Signed)
 Copied from CRM [id number]. Topic: General - Other >> Jul 28, 2023 11:05 AM Dorisann Garre T wrote: Reason for CRM: patient has  uti and wanted to know if she could just do a urine sample instead of having to come in and see the dr she would like a call back

## 2023-07-29 NOTE — Telephone Encounter (Signed)
 Sure.  She can go for urinalysis.  Thank

## 2023-07-29 NOTE — Telephone Encounter (Signed)
 Spoke with the pt and was able to inform her of PCP response "Sure. She can go for urinalysis. Thank" I have also informed the pt that if gets worse before coming in Monday she can go into the UC for care.

## 2023-08-01 ENCOUNTER — Other Ambulatory Visit (INDEPENDENT_AMBULATORY_CARE_PROVIDER_SITE_OTHER)

## 2023-08-01 DIAGNOSIS — R399 Unspecified symptoms and signs involving the genitourinary system: Secondary | ICD-10-CM

## 2023-08-01 LAB — URINALYSIS, ROUTINE W REFLEX MICROSCOPIC
Bilirubin Urine: NEGATIVE
Hgb urine dipstick: NEGATIVE
Nitrite: NEGATIVE
Specific Gravity, Urine: 1.015 (ref 1.000–1.030)
Total Protein, Urine: NEGATIVE
Urine Glucose: NEGATIVE
Urobilinogen, UA: 0.2 (ref 0.0–1.0)
pH: 7 (ref 5.0–8.0)

## 2023-08-02 ENCOUNTER — Encounter: Payer: Self-pay | Admitting: Internal Medicine

## 2023-08-02 ENCOUNTER — Other Ambulatory Visit: Payer: Self-pay | Admitting: Internal Medicine

## 2023-08-02 ENCOUNTER — Telehealth: Payer: Self-pay | Admitting: Internal Medicine

## 2023-08-02 DIAGNOSIS — R399 Unspecified symptoms and signs involving the genitourinary system: Secondary | ICD-10-CM

## 2023-08-02 MED ORDER — CEFUROXIME AXETIL 250 MG PO TABS: 250.0000 mg | ORAL_TABLET | Freq: Two times a day (BID) | ORAL | 1 refills | Status: DC

## 2023-08-02 NOTE — Telephone Encounter (Signed)
 Copied from CRM [id number]. Topic: Referral - Request for Referral >> Aug 02, 2023  9:00 AM Albertha Alosa wrote: Did the patient discuss referral with their provider in the last year? No (If No - schedule appointment) (If Yes - send message)  Appointment offered? Yes- Patient stated she didn't think she needed to see provider since she just saw him in February   Type of order/referral and detailed reason for visit: Urologist   Preference of office, provider, location: Has no preference  If referral order, have you been seen by this specialty before? No (If Yes, this issue or another issue? When? Where?  Can we respond through MyChart? Yes

## 2023-08-03 NOTE — Telephone Encounter (Signed)
 Okay. Thank you.

## 2023-08-15 ENCOUNTER — Encounter: Payer: Self-pay | Admitting: Internal Medicine

## 2023-08-15 ENCOUNTER — Ambulatory Visit: Admitting: Internal Medicine

## 2023-08-15 VITALS — BP 130/78 | HR 77 | Temp 98.4°F | Ht 67.0 in | Wt 205.0 lb

## 2023-08-15 DIAGNOSIS — J029 Acute pharyngitis, unspecified: Secondary | ICD-10-CM

## 2023-08-15 DIAGNOSIS — J069 Acute upper respiratory infection, unspecified: Secondary | ICD-10-CM

## 2023-08-15 DIAGNOSIS — R051 Acute cough: Secondary | ICD-10-CM

## 2023-08-15 LAB — POCT RAPID STREP A (OFFICE): Rapid Strep A Screen: NEGATIVE

## 2023-08-15 LAB — POC COVID19 BINAXNOW: SARS Coronavirus 2 Ag: NEGATIVE

## 2023-08-15 NOTE — Addendum Note (Signed)
 Addended by: Katherene Pals on: 08/15/2023 04:51 PM   Modules accepted: Orders

## 2023-08-15 NOTE — Patient Instructions (Signed)

## 2023-08-15 NOTE — Progress Notes (Signed)
 Subjective:    Patient ID: Teresa Hubbard, female    DOB: 11-08-1960, 63 y.o.   MRN: [id number]      HPI Odeal is here for  Chief Complaint  Patient presents with   Cough    Started on Wednesday; Still coughing and shortness of breath, fatigue    She is here for an acute visit for cold symptoms.   Her symptoms started 5 days ago.    She is experiencing fatigue, sore throat, cough and shortness of breath.  The cough was initially dry, but it seems to be getting wetter/looser.  She denies any fevers, nasal congestion, sinus pain and wheezing.  She has not had any headaches or lightheadedness.  She has tried taking cold and flu medicine      Medications and allergies reviewed with patient and updated if appropriate.  Current Outpatient Medications on File Prior to Visit  Medication Sig Dispense Refill   cefUROXime  (CEFTIN ) 250 MG tablet Take 1 tablet (250 mg total) by mouth 2 (two) times daily with a meal. 10 tablet 1   Cholecalciferol (VITAMIN D3) 50 MCG (2000 UT) capsule Take 1 capsule (2,000 Units total) by mouth daily. (Patient not taking: Reported on 05/06/2023) 100 capsule 3   Cyanocobalamin  (VITAMIN B-12) 1000 MCG SUBL Place 0.5 tablets (500 mcg total) under the tongue daily. (Patient not taking: Reported on 05/06/2023) 100 tablet 3   diclofenac  (VOLTAREN ) 75 MG EC tablet TAKE 1 TABLET (75 MG TOTAL) BY MOUTH 2 (TWO) TIMES DAILY AS NEEDED FOR MODERATE PAIN. 60 tablet 2   EPINEPHrine  (EPIPEN  2-PAK) 0.3 mg/0.3 mL IJ SOAJ injection Inject 0.3 mg into the muscle as needed for anaphylaxis. 1 each 3   hydrocortisone  (ANUSOL -HC) 2.5 % rectal cream Place 1 Application rectally 2 (two) times daily. 60 g 1   icosapent  Ethyl (VASCEPA ) 1 g capsule Take 2 capsules (2 g total) by mouth 2 (two) times daily. 360 capsule 3   Pitavastatin  Magnesium  2 MG TABS Take 2 mg by mouth daily. 30 tablet 11   Probiotic Product (ALIGN) 4 MG CAPS Take 1 capsule (4 mg total) by mouth daily. 30 capsule 5    triamcinolone  ointment (KENALOG ) 0.1 % Apply 1 Application topically 3 (three) times daily as needed. 30 g 1   zolpidem  (AMBIEN ) 10 MG tablet Take 1 tablet (10 mg total) by mouth at bedtime as needed for sleep. 30 tablet 1   No current facility-administered medications on file prior to visit.    Review of Systems  Constitutional:  Positive for fatigue. Negative for chills and fever.  HENT:  Positive for sore throat and voice change. Negative for congestion, ear pain, sinus pressure and sinus pain.   Respiratory:  Positive for cough (dry - >getting loose) and shortness of breath. Negative for wheezing.   Gastrointestinal:  Negative for diarrhea and nausea.  Musculoskeletal:  Negative for myalgias.  Neurological:  Negative for light-headedness and headaches.       Objective:   Vitals:   08/15/23 0952  BP: 130/78  Pulse: 77  Temp: 98.4 F (36.9 C)  SpO2: 97%   BP Readings from Last 3 Encounters:  08/15/23 130/78  05/27/23 112/68  05/06/23 126/76   Wt Readings from Last 3 Encounters:  08/15/23 205 lb (93 kg)  05/27/23 204 lb (92.5 kg)  05/06/23 199 lb (90.3 kg)   Body mass index is 32.11 kg/m.    Physical Exam Constitutional:      General: She is  not in acute distress.    Appearance: Normal appearance.  HENT:     Head: Normocephalic and atraumatic.  Eyes:     Conjunctiva/sclera: Conjunctivae normal.  Cardiovascular:     Rate and Rhythm: Normal rate and regular rhythm.     Heart sounds: Normal heart sounds.  Pulmonary:     Effort: Pulmonary effort is normal. No respiratory distress.     Breath sounds: Normal breath sounds. No wheezing.  Musculoskeletal:     Cervical back: Neck supple.     Right lower leg: No edema.     Left lower leg: No edema.  Lymphadenopathy:     Cervical: No cervical adenopathy.  Skin:    General: Skin is warm and dry.     Findings: No rash.  Neurological:     Mental Status: She is alert. Mental status is at baseline.  Psychiatric:         Mood and Affect: Mood normal.        Behavior: Behavior normal.            Assessment & Plan:    URI -  Acute Symptoms likely viral in nature Rapid strep and covid are negative Continue symptomatic treatment with over-the-counter cold medications, Tylenol /ibuprofen Increase rest and fluids Call if symptoms worsen or do not improve

## 2023-08-17 ENCOUNTER — Ambulatory Visit: Payer: Self-pay

## 2023-08-17 ENCOUNTER — Telehealth: Payer: Self-pay | Admitting: Internal Medicine

## 2023-08-17 MED ORDER — AMOXICILLIN-POT CLAVULANATE 875-125 MG PO TABS: 1.0000 | ORAL_TABLET | Freq: Two times a day (BID) | ORAL | 0 refills | Status: DC

## 2023-08-17 MED ORDER — AZITHROMYCIN 250 MG PO TABS: ORAL_TABLET | ORAL | 0 refills | Status: AC

## 2023-08-17 NOTE — Telephone Encounter (Signed)
 Chief Complaint: follow up recent visit for illness Symptoms: productive cough, headache, sore throat, chest pain after coughing Frequency: x 1 week Pertinent Negatives: Patient denies fever Disposition: [] ED /[] Urgent Care (no appt availability in office) / [] Appointment(In office/virtual)/ []  [facility]/ [] Home Care/ [] Refused Recommended Disposition /[] [facility]/ [x]  Follow-up with PCP Additional Notes: Patient seen on 08/15/23 with Dr Donnette Gal and states she was told to call back if she got worse and she would prescribe antibiotics. Patient states she has been drinking lots of fluid and taking Severe cold and flu OTC/Muccinex DM.  Patient requesting an antibiotic be sent in for her. Attempted call to [facility], no answer.  Copied from CRM [id number]. Topic: Clinical - Medication Question >> Aug 17, 2023  8:04 AM Alyse July wrote: Reason for CRM: Patient was instructed by the provider to contact office if she wasn't feeling better after 08/15/23 Appointment. Patient is still experiencing an uncontrolled cough and has been unable to sleep as a result. Patient would like to know if an antibiotic can be call into the pharmacy for her. She believes she has whooping cough.   Contact number confirmed: [phone]. Pharmacy confirmed: [id number] - [place], [place] - [address] Reason for Disposition  [1] Recent medical visit within 24 hours AND [2] condition / symptoms WORSE  Answer Assessment - Initial Assessment Questions 1. MAIN CONCERN OR SYMPTOM:  "What is your main concern right now?" "What question do you have?" "What's the main symptom you're worried about?" (e.g., breathing difficulty, cough, fever. pain)     Cough attacks, feels like she can't breath by the end of coughing. Throat to chest pain when coughing. Makes her want to vomit. She states this feels like when she had whooping cough. She states the cough has started to loosen up about  2 days ago. Mucus has turned green.  2. ONSET: "When did the  cough  start?"     1 week ago.  3. BETTER-SAME-WORSE: "Are you getting better, staying the same, or getting worse compared to how you felt at your last visit to the doctor (most recent medical visit)?"     Worse. Last night she states she couldn't sleep/  4. VISIT DATE: "When were you seen?" (Date)     08/15/23.  5. VISIT DOCTOR: "What is the name of the doctor taking care of you now?"     Dr Donnette Gal.  6. VISIT DIAGNOSIS:  "What was the main symptom or problem that you were seen for?" "Were you given a diagnosis?"      Viral URI.  7. VISIT MEDICINES: "Did the doctor order any new medicines for you to use?" If Yes, ask: "Have you filled the prescription and started taking the medicine?"      No.  8. NEXT APPOINTMENT: "Have you scheduled a follow-up appointment with your doctor?"     No.  9. PAIN: "Is there any pain?" If Yes, ask: "How bad is it?"  (Scale 0-10; or mild, moderate, severe)    - NONE (0): no pain    - MILD (1-3): doesn't interfere with normal activities     - MODERATE (4-7): interferes with normal activities or awakens from sleep     - SEVERE (8-10): excruciating pain, unable to do any normal activities     Headache moderate pain, chest hurts after coughing attacks.  10. FEVER: "Do you have a fever?" If Yes, ask: "What is it, how was  it measured  and when did it start?"       No.  11. OTHER SYMPTOMS: "Do you have any other symptoms?"       Denies.  Protocols used: Recent Medical Visit for Illness Follow-up Call-A-[facility]

## 2023-08-17 NOTE — Telephone Encounter (Signed)
 Patient does not want Amoxicillin  and message sent to Dr. Donnette Gal in another telephone encounter.

## 2023-08-17 NOTE — Telephone Encounter (Signed)
[facility]ib[facility] to [facility]

## 2023-08-17 NOTE — Telephone Encounter (Signed)
 Copied from CRM [id number]. Topic: Clinical - Prescription Issue >> Aug 17, 2023  1:37 PM Magdalene School wrote: Reason for CRM: Patient calling to check on status of antibiotics. I do see a prescription for amoxicillin -clavulanate (AUGMENTIN ) 875-125 MG tablet but I am unable to see where it was sent to and patient stated that [facility] and [facility] have not [facility] for her.  [id number] - [place], [place] - [address].

## 2023-08-17 NOTE — Telephone Encounter (Signed)
 Patient called back and clarified that she doesn't want the amoxicillin  because it doesn't react well in her system. She is requesting for a different med. Please advise.

## 2023-08-17 NOTE — Addendum Note (Signed)
 Addended by: Colene Dauphin on: 08/17/2023 03:16 PM   Modules accepted: Orders

## 2023-08-17 NOTE — Telephone Encounter (Signed)
 Zpak sent

## 2023-08-18 NOTE — Telephone Encounter (Signed)
 Spoke with patient today.

## 2024-05-31 ENCOUNTER — Encounter: Admitting: Internal Medicine
